# Patient Record
Sex: Female | Born: 1980 | Race: White | Hispanic: No | Marital: Single | Smoking: Current every day smoker
Health system: Southern US, Community
[De-identification: ages and names within clinical notes are randomized; demographics above are authoritative.]

## PROBLEM LIST (undated history)

## (undated) DIAGNOSIS — R519 Headache, unspecified: Secondary | ICD-10-CM

## (undated) DIAGNOSIS — L309 Dermatitis, unspecified: Secondary | ICD-10-CM

## (undated) DIAGNOSIS — G8929 Other chronic pain: Secondary | ICD-10-CM

## (undated) DIAGNOSIS — H5200 Hypermetropia, unspecified eye: Secondary | ICD-10-CM

## (undated) DIAGNOSIS — F419 Anxiety disorder, unspecified: Secondary | ICD-10-CM

## (undated) DIAGNOSIS — M797 Fibromyalgia: Secondary | ICD-10-CM

## (undated) DIAGNOSIS — Z975 Presence of (intrauterine) contraceptive device: Secondary | ICD-10-CM

## (undated) DIAGNOSIS — L509 Urticaria, unspecified: Secondary | ICD-10-CM

## (undated) DIAGNOSIS — R52 Pain, unspecified: Secondary | ICD-10-CM

## (undated) DIAGNOSIS — M542 Cervicalgia: Secondary | ICD-10-CM

## (undated) DIAGNOSIS — M549 Dorsalgia, unspecified: Secondary | ICD-10-CM

## (undated) DIAGNOSIS — R51 Headache: Secondary | ICD-10-CM

## (undated) DIAGNOSIS — T783XXA Angioneurotic edema, initial encounter: Secondary | ICD-10-CM

## (undated) DIAGNOSIS — M25512 Pain in left shoulder: Secondary | ICD-10-CM

## (undated) HISTORY — DX: Anxiety disorder, unspecified: F41.9

## (undated) HISTORY — DX: Presence of (intrauterine) contraceptive device: Z97.5

## (undated) HISTORY — DX: Angioneurotic edema, initial encounter: T78.3XXA

## (undated) HISTORY — DX: Fibromyalgia: M79.7

## (undated) HISTORY — DX: Urticaria, unspecified: L50.9

## (undated) HISTORY — DX: Hypermetropia, unspecified eye: H52.00

## (undated) HISTORY — DX: Dermatitis, unspecified: L30.9

---

## 2003-11-01 ENCOUNTER — Ambulatory Visit (HOSPITAL_COMMUNITY): Admission: EM | Admit: 2003-11-01 | Discharge: 2003-11-01 | Payer: Self-pay | Admitting: Emergency Medicine

## 2004-01-09 ENCOUNTER — Observation Stay (HOSPITAL_COMMUNITY): Admission: AD | Admit: 2004-01-09 | Discharge: 2004-01-10 | Payer: Self-pay | Admitting: Obstetrics and Gynecology

## 2004-01-12 ENCOUNTER — Inpatient Hospital Stay (HOSPITAL_COMMUNITY): Admission: RE | Admit: 2004-01-12 | Discharge: 2004-01-14 | Payer: Self-pay | Admitting: Obstetrics and Gynecology

## 2006-02-16 ENCOUNTER — Inpatient Hospital Stay (HOSPITAL_COMMUNITY): Admission: AD | Admit: 2006-02-16 | Discharge: 2006-02-19 | Payer: Self-pay | Admitting: Obstetrics and Gynecology

## 2009-09-19 ENCOUNTER — Other Ambulatory Visit: Admission: RE | Admit: 2009-09-19 | Discharge: 2009-09-19 | Payer: Self-pay | Admitting: Obstetrics and Gynecology

## 2010-10-30 ENCOUNTER — Other Ambulatory Visit (HOSPITAL_COMMUNITY)
Admission: RE | Admit: 2010-10-30 | Discharge: 2010-10-30 | Disposition: A | Discharge status: Home / Self Care | Payer: Medicaid Other | Source: Ambulatory Visit | Attending: Obstetrics and Gynecology | Admitting: Obstetrics and Gynecology

## 2010-10-30 DIAGNOSIS — Z113 Encounter for screening for infections with a predominantly sexual mode of transmission: Secondary | ICD-10-CM | POA: Insufficient documentation

## 2010-10-30 DIAGNOSIS — Z01419 Encounter for gynecological examination (general) (routine) without abnormal findings: Secondary | ICD-10-CM | POA: Insufficient documentation

## 2011-01-10 NOTE — H&P (Signed)
NAME:  PAZ, FUENTES                         ACCOUNT NO.:  [id number]   MEDICAL RECORD NO.:  [id number]                   PATIENT TYPE:  INP   LOCATION:  A410                                 FACILITY:  [facility]   PHYSICIAN:  Lazaro Arms, M.D.                DATE OF BIRTH:  1980-12-19   DATE OF ADMISSION:  01/12/2004  DATE OF DISCHARGE:  01/14/2004                                HISTORY & PHYSICAL   HISTORY OF PRESENT ILLNESS:  Chelsea Cortez is a 30 year old female, gravida 2,  para 0, abortus 1, with estimated date of delivery of Jan 21, 2004,  currently at [redacted] weeks gestation, who presented to labor and delivery  complaining of __________ uterine contractions.  The patient has had an  otherwise relatively unremarkable antepartum course.  She had a bladder  infection which cleared up and she has been on Lexapro, but otherwise  negative.   PAST MEDICAL HISTORY:  Headaches.   PAST SURGICAL HISTORY:  Negative.   PAST OB HISTORY:  She has had an elective termination in 2002.   MEDICATIONS:  Prenatal vitamins.   REVIEW OF SYSTEMS:  Otherwise negative.   PHYSICAL EXAMINATION:  VITAL SIGNS:  Weight 170 pounds, blood pressure  120/88.  HEENT:  Unremarkable.  NECK:  Thyroid normal.  LUNGS:  Clear.  HEART:  Regular rate and rhythm.  No murmur, rub, or gallop.  BREASTS:  Deferred.  ABDOMEN:  Fundal height of 38 cm.  PELVIC:  Cervix is 4, __________ 5 and minus 2.  EXTREMITIES:  Warm.  No edema.  NEUROLOGIC:  Grossly intact.   LABORATORY DATA:  She is O negative, antibody screen negative.  She has  received RhoGAM.  Her Glucola was normal at 100.  Her group B strep culture  was negative.   IMPRESSION:  Intrauterine pregnancy at term.  Labor.   PLAN:  The patient is admitted for expected management.  She is requesting  an epidural to be placed.    ___________________________________________                                         Lazaro Arms, M.D.   LHE/MEDQ  D:  01/25/2004  T:   01/25/2004  Job:  [id number]
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
## 2011-01-10 NOTE — Op Note (Signed)
NAME:  [name], [name]                         ACCOUNT NO.:  [id number]   MEDICAL RECORD NO.:  [id number]                   PATIENT TYPE:  INP   LOCATION:  A410                                 FACILITY:  [facility]   PHYSICIAN:  Lazaro Arms, M.D.                DATE OF BIRTH:  [birth year]   DATE OF PROCEDURE:  DATE OF DISCHARGE:  01/14/2004                                 OPERATIVE REPORT   EPIDURAL NOTE   Chelsea Cortez is a 30 year old female, gravida 2, para 0, abortus 1 in active  labor requesting epidural be placed for pain management.   DESCRIPTION OF PROCEDURE:  The patient was placed in the sitting position.  Betadine prep was used.  The areas field draped.  Lidocaine 1% is injected  in the L2-L3 interspace.  A 17-gauge Touhy needle was used; the loss of  resistance technique employed, and the epidural is placed without  difficulty.  Ten cc of 0.125% bupivacaine is given as a test dose and the  epidural catheter is threaded into the epidural space without difficulty.  An additional 10 cc is given through the catheter to dose up the epidural  and it is taped down 5 cm into the epidural space.  She is then started on a  continuous pump of 0.125% bupivacaine with 2 mcg/cc of Fentanyl at 12 cc an  hour.  The patient is comfortable.  There are no ill effects.      ___________________________________________                                            Lazaro Arms, M.D.   LHE/MEDQ  D:  01/25/2004  T:  01/25/2004  Job:  [id number]

## 2011-01-10 NOTE — Group Therapy Note (Signed)
NAME:  Chelsea Cortez, Chelsea Cortez               ACCOUNT NO.:  [id number]   MEDICAL RECORD NO.:  [id number]          PATIENT TYPE:  INP   LOCATION:  LDR4                          FACILITY:  [facility]   PHYSICIAN:  Tilda Burrow, M.D. DATE OF BIRTH:  06-Apr-1981   DATE OF PROCEDURE:  DATE OF DISCHARGE:                                   PROGRESS NOTE   DELIVERY NOTE:  We decided to start pushing at 12 p.m. because she had been  complete for about an hour.  However, her epidural was so well set up that  she still did not have an urge to push.  She was fully dilated at +2  station.  Vincenzina pushed very effectively and at 12:30 p.m. she had a  spontaneous vaginal delivery of a viable female infant.  About five minutes  prior to delivery, she had had some bradycardia down in the 60s which would  return to baseline in between contractions.  She was given O2 at 12 liters  via nonrebreather mask and the vertex was elevated slightly in between  contractions.  There was no nuchal cord noted.  The mouth and nose were  suctioned.  After delivery of the head, the baby was rotated  counterclockwise into an oblique position and, after the anterior right arm  was delivered, the patient came without difficulty after that.  Amarissa, in  fact, helped deliver the baby after the shoulders were out by pulling him up  towards her.  Weight is 8 pounds 4 ounces, Apgar's are 9 and 9.  20 units of  pitocin diluted of 1000 cc of lactated ringers was infused rapidly IV.  The  placenta separated spontaneously and delivered via controlled cord traction  at 12:40 p.m.  It was inspected and appears to be intact with a three-vessel  cord.  The fundus was firm and minimal blood loss was noted.  Estimated  blood loss 200 cc.  The epidural catheter was removed and the blue tip  visualized as being intact.  No lacerations were found upon inspection of  the vagina.      Jacklyn Shell, C.N.M.      Tilda Burrow, M.D.  Electronically Signed    FC/MEDQ  D:  02/17/2006  T:  02/17/2006  Job:  [id number]   cc:   [facility]   W. Simone Curia, M.D.  Fax: [phone]

## 2011-01-10 NOTE — H&P (Signed)
NAMESAI, MOURA               ACCOUNT NO.:  [id number]   MEDICAL RECORD NO.:  [id number]          PATIENT TYPE:  INP   LOCATION:  A403                          FACILITY:  [facility]   PHYSICIAN:  Tilda Burrow, M.D. DATE OF BIRTH:  09/21/1980   DATE OF ADMISSION:  02/16/2006  DATE OF DISCHARGE:  LH                                HISTORY & PHYSICAL   CHIEF COMPLAINT:  Induction of labor secondary to maternal request.   HISTORY OF PRESENT ILLNESS:  Chelsea Cortez is a 30 year old gravida 3, para 1, AB  1 with an EDC of June, 27, 2007, placing her at [redacted] weeks gestation.  She  began prenatal care in what she thought was her first trimester, but she was  actually [redacted] weeks pregnant when she started.  She has had regular visits  since then.  Her prenatal course has basically been uneventful.  Total  weight gain has been about 15 pounds with appropriate fundal height growth.  Blood pressures have been 110s to 120s over 60-80s.   PRENATAL LABS:  Blood type O negative.  She did receive RhoGAM on  11/27/2005, rubella immune.  HV, SAG, HIV, HSV, RPR, gonorrhea, chlamydia  are all negative.  She has a history of group B strep with her prior  pregnancy.  One-hour GCT was normal at 96.   PAST MEDICAL HISTORY:  Noncontributory.   SURGICAL HISTORY:  None.   ALLERGIES:  No known drug allergies.   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  She did test positive for  marijuana in her first trimester, with a negative drug screen after that.   FAMILY HISTORY:  Positive for diabetes, hypertension and coronary artery  disease.   OBSTETRIC HISTORY:  Had a spontaneous vaginal delivery, after that a 2-hour  labor at 40 weeks of a 7-pound 1-ounce female in 2005.  She has a TAB.   PHYSICAL EXAMINATION:  HEENT:  Within normal limits.  HEART:  Regular rate and rhythm.  LUNGS:  Clear.  ABDOMEN:  Soft and nontender.  Fundal height is  about 38 centimeters,  estimated fetal weight about 8 pounds.  CERVICAL:  Cervix is 2-3 cm, 50% effaced, -1 station.  LEGS:  Trace edema.   IMPRESSION:  IEP at 40 weeks, selective induction of labor.   PLAN:  Will bring her into labor and delivery on the evening of January 28, 2006  for a Foley pre-induction and AROM and Pitocin in the morning.      Jacklyn Shell, C.N.M.      Tilda Burrow, M.D.  Electronically Signed    FC/MEDQ  D:  02/19/2006  T:  02/19/2006  Job:  [id number]   cc:   Lorin Picket A. Gerda Diss, MD  Fax: [phone]   W. Simone Curia, M.D.  Fax: [phone]

## 2011-01-10 NOTE — Op Note (Signed)
NAME:  Chelsea Cortez, Chelsea Cortez                         ACCOUNT NO.:  [id number]   MEDICAL RECORD NO.:  [id number]                   PATIENT TYPE:  OIB   LOCATION:  A427                                 FACILITY:  [facility]   PHYSICIAN:  Lazaro Arms, M.D.                DATE OF BIRTH:  [birth year]   DATE OF PROCEDURE:  01/12/2004  DATE OF DISCHARGE:                                 OPERATIVE REPORT   DELIVERY NOTE   [name] was noted to be fully dilated at +2 station at approximately 10 a.m.  She pushed effectively and steadily over 2 hours and delivered a viable female  infant, spontaneously, at 1150.  The mouth and nose were suctioned on the  perineum with the DeLee suction.  A compound right hand was delivered next  and the body delivered without difficulty.  Despite lack of stimulation the  baby did make some spontaneous respiratory efforts.  He was transferred to  the radiant warmer where Dr. Vivia Ewing was awaiting in attendance.  You  can see his notes for procedures performed.  The placenta separated  spontaneously and delivered at 1153.  Twenty units of Pitocin diluted in  1000 cc of lactated Ringers was then infused rapidly IV.   The fundus was immediately firm and very little blood loss.  The placenta  was inspected and appears to be intact with a 3-vessel cord.  It will be  sent to pathology for review due to meconium staining.  The vagina was then  inspected and no lacerations were found. EBL 200 cc.  The epidural catheter  was then removed with the blue tip visualized as being intact.     ________________________________________  ___________________________________________  Jacklyn Shell, C.N.M.           Lazaro Arms, M.D.   FC/MEDQ  D:  01/12/2004  T:  01/12/2004  Job:  [id number]   cc:   [facility]

## 2011-01-10 NOTE — Op Note (Signed)
Chelsea Cortez, RUPPEL               ACCOUNT NO.:  [id number]   MEDICAL RECORD NO.:  [id number]          PATIENT TYPE:  INP   LOCATION:  A403                          FACILITY:  [facility]   PHYSICIAN:  Lazaro Arms, M.D.   DATE OF BIRTH:  07/27/1981   DATE OF PROCEDURE:  DATE OF DISCHARGE:  02/19/2006                                 OPERATIVE REPORT   PROCEDURE PERFORMED:  Epidural catheter placement.   Cathlene is a 30 year old white female gravida 2, para 1 at 40 weeks of  gestation who is in active phase of labor requesting epidural to be placed.  She is placed in sitting position.  Betadine prep was used.  1% lidocaine  was injected to the L3, L4 interspace.  The 17 gauge Tuohy needle was used  and loss of resistance technique employed and the epidural space was found  with one pass without difficulty.  10 mL of 0.125% bupivacaine plain was  given and the epidural catheter was fed.  However, when it is fed, it feeds  into a vein and it pulls back blood.  Therefore, I went to the L2-L3  interspace.  I once again used the loss of resistance technique, found the  epidural space in one pass and placed the epidural catheter without  difficulty.  Continuous infusion was then begun at 12 mL an hour.  The  patient tolerated the procedure well and is getting good pain relief.      Lazaro Arms, M.D.  Electronically Signed     LHE/MEDQ  D:  03/19/2006  T:  03/19/2006  Job:  [id number]

## 2011-11-04 ENCOUNTER — Other Ambulatory Visit: Payer: Self-pay | Admitting: Adult Health

## 2011-11-04 ENCOUNTER — Other Ambulatory Visit (HOSPITAL_COMMUNITY)
Admission: RE | Admit: 2011-11-04 | Discharge: 2011-11-04 | Disposition: A | Discharge status: Home / Self Care | Payer: Medicaid Other | Source: Ambulatory Visit | Attending: Obstetrics and Gynecology | Admitting: Obstetrics and Gynecology

## 2011-11-04 DIAGNOSIS — Z01419 Encounter for gynecological examination (general) (routine) without abnormal findings: Secondary | ICD-10-CM | POA: Insufficient documentation

## 2011-11-04 DIAGNOSIS — Z113 Encounter for screening for infections with a predominantly sexual mode of transmission: Secondary | ICD-10-CM | POA: Insufficient documentation

## 2011-11-19 ENCOUNTER — Ambulatory Visit (HOSPITAL_COMMUNITY)
Admission: RE | Admit: 2011-11-19 | Discharge: 2011-11-19 | Disposition: A | Discharge status: Home / Self Care | Payer: Medicaid Other | Source: Ambulatory Visit | Attending: Adult Health | Admitting: Adult Health

## 2011-11-19 ENCOUNTER — Other Ambulatory Visit: Payer: Self-pay | Admitting: Adult Health

## 2011-11-19 DIAGNOSIS — Z09 Encounter for follow-up examination after completed treatment for conditions other than malignant neoplasm: Secondary | ICD-10-CM

## 2011-11-19 DIAGNOSIS — N63 Unspecified lump in unspecified breast: Secondary | ICD-10-CM | POA: Insufficient documentation

## 2012-01-07 ENCOUNTER — Other Ambulatory Visit: Payer: Self-pay | Admitting: Adult Health

## 2012-01-07 DIAGNOSIS — N63 Unspecified lump in unspecified breast: Secondary | ICD-10-CM

## 2012-02-04 ENCOUNTER — Ambulatory Visit (HOSPITAL_COMMUNITY)
Admission: RE | Admit: 2012-02-04 | Discharge: 2012-02-04 | Disposition: A | Discharge status: Home / Self Care | Payer: Medicaid Other | Source: Ambulatory Visit | Attending: Adult Health | Admitting: Adult Health

## 2012-02-04 DIAGNOSIS — N63 Unspecified lump in unspecified breast: Secondary | ICD-10-CM | POA: Insufficient documentation

## 2012-04-28 ENCOUNTER — Ambulatory Visit (HOSPITAL_COMMUNITY): Payer: Medicaid Other

## 2013-04-04 ENCOUNTER — Encounter: Payer: Self-pay | Admitting: Obstetrics & Gynecology

## 2013-04-04 ENCOUNTER — Ambulatory Visit (INDEPENDENT_AMBULATORY_CARE_PROVIDER_SITE_OTHER): Payer: Medicaid Other | Admitting: Obstetrics & Gynecology

## 2013-04-04 ENCOUNTER — Other Ambulatory Visit (HOSPITAL_COMMUNITY)
Admission: RE | Admit: 2013-04-04 | Discharge: 2013-04-04 | Disposition: A | Discharge status: Home / Self Care | Payer: Medicaid Other | Source: Ambulatory Visit | Attending: Obstetrics & Gynecology | Admitting: Obstetrics & Gynecology

## 2013-04-04 VITALS — BP 100/60 | Ht 66.0 in | Wt 159.0 lb

## 2013-04-04 DIAGNOSIS — Z1151 Encounter for screening for human papillomavirus (HPV): Secondary | ICD-10-CM | POA: Insufficient documentation

## 2013-04-04 DIAGNOSIS — Z113 Encounter for screening for infections with a predominantly sexual mode of transmission: Secondary | ICD-10-CM | POA: Insufficient documentation

## 2013-04-04 DIAGNOSIS — Z3049 Encounter for surveillance of other contraceptives: Secondary | ICD-10-CM

## 2013-04-04 DIAGNOSIS — Z01419 Encounter for gynecological examination (general) (routine) without abnormal findings: Secondary | ICD-10-CM | POA: Insufficient documentation

## 2013-04-04 LAB — RPR

## 2013-04-04 LAB — HIV ANTIBODY (ROUTINE TESTING W REFLEX): HIV: NONREACTIVE

## 2013-04-04 LAB — HEPATITIS C ANTIBODY: HCV Ab: NEGATIVE

## 2013-04-04 LAB — HEPATITIS B SURFACE ANTIGEN: Hepatitis B Surface Ag: NEGATIVE

## 2013-04-04 MED ORDER — IBUPROFEN 800 MG PO TABS
800.0000 mg | ORAL_TABLET | Freq: Three times a day (TID) | ORAL | Status: DC | PRN
Start: 2013-04-04 — End: 2013-12-23

## 2013-04-04 NOTE — Addendum Note (Signed)
Addended by: Lazaro Arms on: 04/04/2013 11:49 AM   Modules accepted: Orders

## 2013-04-04 NOTE — Progress Notes (Signed)
Patient ID: Chelsea Cortez, female   DOB: 07-30-1981, 32 y.o.   MRN: [id number] Subjective:     Chelsea Cortez is a 32 y.o. female here for a routine exam.  Patient's last menstrual period was 02/22/2013. G2P2 Current complaints: none.  Personal health questionnaire reviewed: no.   Gynecologic History Patient's last menstrual period was 02/22/2013. Contraception: Nexplanon Last Pap: 2013. Results were: normal Last mammogram: 2013. Results were: normal  Obstetric History OB History   Grav Para Term Preterm Abortions TAB SAB Ect Mult Living   2 2             # Outc Date GA Lbr Len/2nd Wgt Sex Del Anes PTL Lv   1 PAR            2 PAR                The following portions of the patient's history were reviewed and updated as appropriate: allergies, current medications, past family history, past medical history, past social history, past surgical history and problem list.  Review of Systems  Review of Systems  Constitutional: Negative for fever, chills, weight loss, malaise/fatigue and diaphoresis.  HENT: Negative for hearing loss, ear pain, nosebleeds, congestion, sore throat, neck pain, tinnitus and ear discharge.   Eyes: Negative for blurred vision, double vision, photophobia, pain, discharge and redness.  Respiratory: Negative for cough, hemoptysis, sputum production, shortness of breath, wheezing and stridor.   Cardiovascular: Negative for chest pain, palpitations, orthopnea, claudication, leg swelling and PND.  Gastrointestinal: negative for abdominal pain. Negative for heartburn, nausea, vomiting, diarrhea, constipation, blood in stool and melena.  Genitourinary: Negative for dysuria, urgency, frequency, hematuria and flank pain.  Musculoskeletal: Negative for myalgias, back pain, joint pain and falls.  Skin: Negative for itching and rash.  Neurological: Negative for dizziness, tingling, tremors, sensory change, speech change, focal weakness, seizures, loss of consciousness,  weakness and headaches.  Endo/Heme/Allergies: Negative for environmental allergies and polydipsia. Does not bruise/bleed easily.  Psychiatric/Behavioral: Negative for depression, suicidal ideas, hallucinations, memory loss and substance abuse. The patient is not nervous/anxious and does not have insomnia.        Objective:    Physical Exam  Vitals reviewed. Constitutional: She is oriented to person, place, and time. She appears well-developed and well-nourished.  HENT:  Head: Normocephalic and atraumatic.        Right Ear: External ear normal.  Left Ear: External ear normal.  Nose: Nose normal.  Mouth/Throat: Oropharynx is clear and moist.  Eyes: Conjunctivae and EOM are normal. Pupils are equal, round, and reactive to light. Right eye exhibits no discharge. Left eye exhibits no discharge. No scleral icterus.  Neck: Normal range of motion. Neck supple. No tracheal deviation present. No thyromegaly present.  Cardiovascular: Normal rate, regular rhythm, normal heart sounds and intact distal pulses.  Exam reveals no gallop and no friction rub.   No murmur heard. Respiratory: Effort normal and breath sounds normal. No respiratory distress. She has no wheezes. She has no rales. She exhibits no tenderness.  GI: Soft. Bowel sounds are normal. She exhibits no distension and no mass. There is no tenderness. There is no rebound and no guarding.  Genitourinary:       Vulva is normal without lesions Vagina is pink moist without discharge Cervix normal in appearance and pap is done Uterus is normal size shape and contour Adnexa is negative with normal sized ovaries   Musculoskeletal: Normal range of motion. She exhibits no edema  and no tenderness.  Neurological: She is alert and oriented to person, place, and time. She has normal reflexes. She displays normal reflexes. No cranial nerve deficit. She exhibits normal muscle tone. Coordination normal.  Skin: Skin is warm and dry. No rash noted. No  erythema. No pallor.  Psychiatric: She has a normal mood and affect. Her behavior is normal. Judgment and thought content normal.       Assessment:    Healthy female exam.    Plan:    Contraception: Nexplanon. Follow up in: 1 year.

## 2013-04-04 NOTE — Patient Instructions (Signed)

## 2013-04-04 NOTE — Addendum Note (Signed)
Addended by: Colen Darling on: 04/04/2013 12:13 PM   Modules accepted: Orders

## 2013-08-25 DIAGNOSIS — R52 Pain, unspecified: Secondary | ICD-10-CM

## 2013-08-25 HISTORY — DX: Pain, unspecified: R52

## 2013-12-23 ENCOUNTER — Telehealth: Payer: Self-pay | Admitting: *Deleted

## 2013-12-23 ENCOUNTER — Encounter: Payer: Self-pay | Admitting: Obstetrics & Gynecology

## 2013-12-23 ENCOUNTER — Ambulatory Visit (INDEPENDENT_AMBULATORY_CARE_PROVIDER_SITE_OTHER): Payer: BC Managed Care – PPO | Admitting: Obstetrics & Gynecology

## 2013-12-23 VITALS — BP 110/70 | Wt 163.0 lb

## 2013-12-23 DIAGNOSIS — Z113 Encounter for screening for infections with a predominantly sexual mode of transmission: Secondary | ICD-10-CM

## 2013-12-23 DIAGNOSIS — Z7251 High risk heterosexual behavior: Secondary | ICD-10-CM

## 2013-12-23 MED ORDER — IBUPROFEN 800 MG PO TABS: 800.0000 mg | ORAL_TABLET | Freq: Three times a day (TID) | ORAL | Status: DC | PRN

## 2013-12-23 NOTE — Addendum Note (Signed)
Addended by: Lazaro ArmsEURE, Clerance Umland H on: 12/23/2013 01:14 PM   Modules accepted: Orders

## 2013-12-23 NOTE — Progress Notes (Signed)
Patient ID: Chelsea Cortez, female   DOB: 26-Aug-1980, 33 y.o.   MRN: [id number] Pt presents with concerns of herpetic exposure No real concern over currently active lesions Also wants full STD screening  Exam Minimal vertical skin tears on the right of the clitoral hood but no ulcerative lesions are noted No warts or other lesions are present  Check STD labs at pt request No active issues noted today on exam

## 2013-12-23 NOTE — Addendum Note (Signed)
Addended by: Criss AlvinePULLIAM, CHRYSTAL G on: 12/23/2013 01:42 PM   Modules accepted: Orders

## 2013-12-23 NOTE — Telephone Encounter (Signed)
Left message letting pt know she was not tested for herpes. Advised if she is having a concern and feels like she needs to be tested, just call office for appt. JSY

## 2013-12-24 LAB — RPR

## 2013-12-24 LAB — GC/CHLAMYDIA PROBE AMP
CT PROBE, AMP APTIMA: NEGATIVE
GC Probe RNA: NEGATIVE

## 2013-12-24 LAB — HIV ANTIBODY (ROUTINE TESTING W REFLEX): HIV 1&2 Ab, 4th Generation: NONREACTIVE

## 2013-12-24 LAB — HEPATITIS B SURFACE ANTIGEN: HEP B S AG: NEGATIVE

## 2013-12-24 LAB — HEPATITIS C ANTIBODY: HCV AB: NEGATIVE

## 2013-12-26 ENCOUNTER — Telehealth: Payer: Self-pay | Admitting: Obstetrics & Gynecology

## 2013-12-26 NOTE — Telephone Encounter (Signed)
Pt thought that Dr. Despina HiddenEure was going to send the Xanax as well. Can Dr. Despina HiddenEure call in the xanax.

## 2013-12-27 ENCOUNTER — Telehealth: Payer: Self-pay | Admitting: Obstetrics & Gynecology

## 2013-12-27 ENCOUNTER — Other Ambulatory Visit: Payer: Self-pay | Admitting: Obstetrics & Gynecology

## 2013-12-27 LAB — HSV 2 ANTIBODY, IGG

## 2013-12-27 MED ORDER — ALPRAZOLAM 0.5 MG PO TABS: 0.5000 mg | ORAL_TABLET | Freq: Every evening | ORAL | Status: DC | PRN

## 2013-12-27 NOTE — Telephone Encounter (Signed)
Pt aware that [facility], and aware that she will not receive another prescription.

## 2013-12-28 ENCOUNTER — Telehealth: Payer: Self-pay | Admitting: Obstetrics and Gynecology

## 2013-12-28 NOTE — Telephone Encounter (Signed)
Spoke with pt. All labs were normal. JSY

## 2014-01-02 ENCOUNTER — Encounter: Payer: Self-pay | Admitting: Women's Health

## 2014-01-02 ENCOUNTER — Ambulatory Visit (INDEPENDENT_AMBULATORY_CARE_PROVIDER_SITE_OTHER): Payer: BC Managed Care – PPO | Admitting: Women's Health

## 2014-01-02 VITALS — BP 130/76 | Ht 67.0 in | Wt 166.5 lb

## 2014-01-02 DIAGNOSIS — Z30017 Encounter for initial prescription of implantable subdermal contraceptive: Secondary | ICD-10-CM

## 2014-01-02 DIAGNOSIS — Z3046 Encounter for surveillance of implantable subdermal contraceptive: Secondary | ICD-10-CM

## 2014-01-02 DIAGNOSIS — Z3202 Encounter for pregnancy test, result negative: Secondary | ICD-10-CM

## 2014-01-02 LAB — POCT URINE PREGNANCY: Preg Test, Ur: NEGATIVE

## 2014-01-02 MED ORDER — MIRABEGRON ER 25 MG PO TB24: 25.0000 mg | ORAL_TABLET | Freq: Every day | ORAL | Status: DC

## 2014-01-02 NOTE — Progress Notes (Signed)
Patient ID: Chelsea Cortez, female   DOB: 1980/12/24, 33 y.o.   MRN: [id number] Chelsea Cortez is a 33 y.o. year old Caucasian female here for Implanon removal and Nexplanon insertion. She also reports urge incontinence all day, everyday, requests meds to help.  Her Implanon was placed 09/26/09. Patient given informed consent for removal of her Implanon.  Risks/benefits/side effects of Nexplanon have been discussed and her questions have been answered.  Specifically, a failure rate of 08/998 has been reported, with an increased failure rate if pt takes St. John's Wort and/or antiseizure medicaitons.  Chelsea Cortez is aware of the common side effect of irregular bleeding, which the incidence of decreases over time.  BP 130/76  Ht 5\' 7"  (1.702 m)  Wt 166 lb 8 oz (75.524 kg)  BMI 26.07 kg/m2  LMP 12/21/2013  Appropriate time out taken. Implanon site identified.  Area prepped in usual sterile fashon. One cc of 2% lidocaine was used to anesthetize the area at the distal end of the implant. A small stab incision was made right beside the implant on the distal portion.  The Implanon rod was grasped using hemostats and removed without difficulty.  The area was cleansed again with betadine and the Nexplanon was inserted per manufacturer's recommendations without difficulty.  A steri-strip and pressure bandage were applied.There was less than 3 cc blood loss. There were no complications.    Results for orders placed in visit on 01/02/14 (from the past 24 hour(s))  POCT URINE PREGNANCY   Collection Time    01/02/14  9:02 AM      Result Value Ref Range   Preg Test, Ur Negative      Pt was instructed to keep the area clean and dry, remove pressure bandage in 24 hours, and keep insertion site covered with the steri-strip for 3-5 days.  Back up contraception was recommended for 2 weeks.  She was given a card indicating date Nexplanon was inserted and date it needs to be removed. Follow-up PRN problems.   Rx  Myrbetriq 25mg  daily Requests yearly labs, not fasting today, to come back one am this week for CBC, CMP, TSH, Lipid panel  Marge DuncansKimberly Randall Veera Stapleton CNM, [facility]NP-BC 01/02/2014 9:25 AM

## 2014-01-02 NOTE — Patient Instructions (Signed)
Keep the area clean and dry.  You can remove the big bandage in 24 hours, and the small steri-strip bandage in 3-5 days.  A back up method, such as condoms, should be used for two weeks. You may have irregular vaginal bleeding for the first 6 months after the Nexplanon is placed, then the bleeding usually lightens and it is possible that you may not have any periods.  If you have any concerns, please give us a call.   Etonogestrel implant What is this medicine? ETONOGESTREL (et oh noe JES trel) is a contraceptive (birth control) device. It is used to prevent pregnancy. It can be used for up to 3 years. This medicine may be used for other purposes; ask your health care provider or pharmacist if you have questions. COMMON BRAND NAME(S): Implanon, Nexplanon  What should I tell my health care provider before I take this medicine? They need to know if you have any of these conditions: -abnormal vaginal bleeding -blood vessel disease or blood clots -cancer of the breast, cervix, or liver -depression -diabetes -gallbladder disease -headaches -heart disease or recent heart attack -high blood pressure -high cholesterol -kidney disease -liver disease -renal disease -seizures -tobacco smoker -an unusual or allergic reaction to etonogestrel, other hormones, anesthetics or antiseptics, medicines, foods, dyes, or preservatives -pregnant or trying to get pregnant -breast-feeding How should I use this medicine? This device is inserted just under the skin on the inner side of your upper arm by a health care professional. Talk to your pediatrician regarding the use of this medicine in children. Special care may be needed. Overdosage: If you think you've taken too much of this medicine contact a poison control center or emergency room at once. Overdosage: If you think you have taken too much of this medicine contact a poison control center or emergency room at once. NOTE: This medicine is only for you.  Do not share this medicine with others. What if I miss a dose? This does not apply. What may interact with this medicine? Do not take this medicine with any of the following medications: -amprenavir -bosentan -fosamprenavir This medicine may also interact with the following medications: -barbiturate medicines for inducing sleep or treating seizures -certain medicines for fungal infections like ketoconazole and itraconazole -griseofulvin -medicines to treat seizures like carbamazepine, felbamate, oxcarbazepine, phenytoin, topiramate -modafinil -phenylbutazone -rifampin -some medicines to treat HIV infection like atazanavir, indinavir, lopinavir, nelfinavir, tipranavir, ritonavir -St. John's wort This list may not describe all possible interactions. Give your health care provider a list of all the medicines, herbs, non-prescription drugs, or dietary supplements you use. Also tell them if you smoke, drink alcohol, or use illegal drugs. Some items may interact with your medicine. What should I watch for while using this medicine? This product does not protect you against HIV infection (AIDS) or other sexually transmitted diseases. You should be able to feel the implant by pressing your fingertips over the skin where it was inserted. Tell your doctor if you cannot feel the implant. What side effects may I notice from receiving this medicine? Side effects that you should report to your doctor or health care professional as soon as possible: -allergic reactions like skin rash, itching or hives, swelling of the face, lips, or tongue -breast lumps -changes in vision -confusion, trouble speaking or understanding -dark urine -depressed mood -general ill feeling or flu-like symptoms -light-colored stools -loss of appetite, nausea -right upper belly pain -severe headaches -severe pain, swelling, or tenderness in the abdomen -shortness of  breath, chest pain, swelling in a leg -signs of  pregnancy -sudden numbness or weakness of the face, arm or leg -trouble walking, dizziness, loss of balance or coordination -unusual vaginal bleeding, discharge -unusually weak or tired -yellowing of the eyes or skin Side effects that usually do not require medical attention (Report these to your doctor or health care professional if they continue or are bothersome.): -acne -breast pain -changes in weight -cough -fever or chills -headache -irregular menstrual bleeding -itching, burning, and vaginal discharge -pain or difficulty passing urine -sore throat This list may not describe all possible side effects. Call your doctor for medical advice about side effects. You may report side effects to FDA at 1-800-FDA-1088. Where should I keep my medicine? This drug is given in a hospital or clinic and will not be stored at home. NOTE: This sheet is a summary. It may not cover all possible information. If you have questions about this medicine, talk to your doctor, pharmacist, or health care provider.  2014, Elsevier/Gold Standard. (2012-02-16 15:37:45)  Urinary Incontinence Urinary incontinence is the involuntary loss of urine from your bladder. CAUSES  There are many causes of urinary incontinence. They include:  Medicines.  Infections.  Prostatic enlargement, leading to overflow of urine from your bladder.  Surgery.  Neurological diseases.  Emotional factors. SIGNS AND SYMPTOMS Urinary Incontinence can be divided into four types: 1. Urge incontinence. Urge incontinence is the involuntary loss of urine before you have the opportunity to go to the bathroom. There is a sudden urge to void but not enough time to reach a bathroom. 2. Stress incontinence. Stress incontinence is the sudden loss of urine with any activity that forces urine to pass. It is commonly caused by anatomical changes to the pelvis and sphincter areas of your body. 3. Overflow incontinence. Overflow incontinence  is the loss of urine from an obstructed opening to your bladder. This results in a backup of urine and a resultant buildup of pressure within the bladder. When the pressure within the bladder exceeds the closing pressure of the sphincter, the urine overflows, which causes incontinence, similar to water overflowing a dam. 4. Total incontinence. Total incontinence is the loss of urine as a result of the inability to store urine within your bladder. DIAGNOSIS  Evaluating the cause of incontinence may require:  A thorough and complete medical and obstetric history.  A complete physical exam.  Laboratory tests such as a urine culture and sensitivities. When additional tests are indicated, they can include:  An ultrasound exam.  Kidney and bladder X-rays.  Cystoscopy. This is an exam of the bladder using a narrow scope.  Urodynamic testing to test the nerve function to the bladder and sphincter areas. TREATMENT  Treatment for urinary incontinence depends on the cause:  For urge incontinence caused by a bacterial infection, antibiotics will be prescribed. If the urge incontinence is related to medicines you take, your health care provider may have you change the medicine.  For stress incontinence, surgery to re-establish anatomical support to the bladder or sphincter, or both, will often correct the condition.  For overflow incontinence caused by an enlarged prostate, an operation to open the channel through the enlarged prostate will allow the flow of urine out of the bladder. In women with fibroids, a hysterectomy may be recommended.  For total incontinence, surgery on your urinary sphincter may help. An artificial urinary sphincter (an inflatable cuff placed around the urethra) may be required. In women who have developed a hole-like passage  between their bladder and vagina (vesicovaginal fistula), surgery to close the fistula often is required. HOME CARE INSTRUCTIONS  Normal daily hygiene  and the use of pads or adult diapers that are changed regularly will help prevent odors and skin damage.  Avoid caffeine. It can overstimulate your bladder.  Use the bathroom regularly. Try about every 2 3 hours to go to the bathroom, even if you do not feel the need to do so. Take time to empty your bladder completely. After urinating, wait a minute. Then try to urinate again.  For causes involving nerve dysfunction, keep a log of the medicines you take and a journal of the times you go to the bathroom. SEEK MEDICAL CARE IF:  You experience worsening of pain instead of improvement in pain after your procedure.  Your incontinence becomes worse instead of better. SEE IMMEDIATE MEDICAL CARE IF:  You experience fever or shaking chills.  You are unable to pass your urine.  You have redness spreading into your groin or down into your thighs. MAKE SURE YOU:   Understand these instructions.   Will watch your condition.  Will get help right away if you are not doing well or get worse. Document Released: 09/18/2004 Document Revised: 06/01/2013 Document Reviewed: 01/18/2013 [facility] Patient Information 2014 [place].

## 2014-01-02 NOTE — Addendum Note (Signed)
Addended by: Cheral MarkerBOOKER, Jamaar Howes R on: 01/02/2014 09:35 AM   Modules accepted: Level of Service

## 2014-01-03 ENCOUNTER — Other Ambulatory Visit: Payer: BC Managed Care – PPO

## 2014-01-03 DIAGNOSIS — Z13220 Encounter for screening for lipoid disorders: Secondary | ICD-10-CM

## 2014-01-03 DIAGNOSIS — Z1329 Encounter for screening for other suspected endocrine disorder: Secondary | ICD-10-CM

## 2014-01-03 DIAGNOSIS — Z0000 Encounter for general adult medical examination without abnormal findings: Secondary | ICD-10-CM

## 2014-01-03 LAB — COMPREHENSIVE METABOLIC PANEL WITH GFR
ALT: 12 U/L (ref 0–35)
AST: 16 U/L (ref 0–37)
Albumin: 4.1 g/dL (ref 3.5–5.2)
Alkaline Phosphatase: 51 U/L (ref 39–117)
BUN: 11 mg/dL (ref 6–23)
CO2: 24 meq/L (ref 19–32)
Calcium: 9 mg/dL (ref 8.4–10.5)
Chloride: 108 meq/L (ref 96–112)
Creat: 0.77 mg/dL (ref 0.50–1.10)
Glucose, Bld: 83 mg/dL (ref 70–99)
Potassium: 4.2 meq/L (ref 3.5–5.3)
Sodium: 140 meq/L (ref 135–145)
Total Bilirubin: 0.5 mg/dL (ref 0.2–1.2)
Total Protein: 6.2 g/dL (ref 6.0–8.3)

## 2014-01-03 LAB — LIPID PANEL
Cholesterol: 124 mg/dL (ref 0–200)
HDL: 38 mg/dL — ABNORMAL LOW
LDL Cholesterol: 76 mg/dL (ref 0–99)
Total CHOL/HDL Ratio: 3.3 ratio
Triglycerides: 52 mg/dL
VLDL: 10 mg/dL (ref 0–40)

## 2014-01-03 LAB — CBC
HCT: 43.9 % (ref 36.0–46.0)
Hemoglobin: 15 g/dL (ref 12.0–15.0)
MCH: 31.8 pg (ref 26.0–34.0)
MCHC: 34.2 g/dL (ref 30.0–36.0)
MCV: 93.2 fL (ref 78.0–100.0)
Platelets: 195 K/uL (ref 150–400)
RBC: 4.71 MIL/uL (ref 3.87–5.11)
RDW: 12.7 % (ref 11.5–15.5)
WBC: 5.6 K/uL (ref 4.0–10.5)

## 2014-01-04 LAB — TSH: TSH: 1.69 u[IU]/mL (ref 0.350–4.500)

## 2014-01-10 ENCOUNTER — Emergency Department (HOSPITAL_COMMUNITY)
Admission: EM | Admit: 2014-01-10 | Discharge: 2014-01-10 | Disposition: A | Discharge status: Home / Self Care | Payer: BC Managed Care – PPO | Attending: Emergency Medicine | Admitting: Emergency Medicine

## 2014-01-10 ENCOUNTER — Encounter (HOSPITAL_COMMUNITY): Payer: Self-pay | Admitting: Emergency Medicine

## 2014-01-10 DIAGNOSIS — Y929 Unspecified place or not applicable: Secondary | ICD-10-CM | POA: Insufficient documentation

## 2014-01-10 DIAGNOSIS — Z8669 Personal history of other diseases of the nervous system and sense organs: Secondary | ICD-10-CM | POA: Insufficient documentation

## 2014-01-10 DIAGNOSIS — Y939 Activity, unspecified: Secondary | ICD-10-CM | POA: Insufficient documentation

## 2014-01-10 DIAGNOSIS — Z79899 Other long term (current) drug therapy: Secondary | ICD-10-CM | POA: Insufficient documentation

## 2014-01-10 DIAGNOSIS — F17200 Nicotine dependence, unspecified, uncomplicated: Secondary | ICD-10-CM | POA: Insufficient documentation

## 2014-01-10 DIAGNOSIS — IMO0002 Reserved for concepts with insufficient information to code with codable children: Secondary | ICD-10-CM | POA: Insufficient documentation

## 2014-01-10 DIAGNOSIS — S30860A Insect bite (nonvenomous) of lower back and pelvis, initial encounter: Secondary | ICD-10-CM | POA: Insufficient documentation

## 2014-01-10 DIAGNOSIS — Z792 Long term (current) use of antibiotics: Secondary | ICD-10-CM | POA: Insufficient documentation

## 2014-01-10 DIAGNOSIS — Z9109 Other allergy status, other than to drugs and biological substances: Secondary | ICD-10-CM

## 2014-01-10 DIAGNOSIS — W57XXXA Bitten or stung by nonvenomous insect and other nonvenomous arthropods, initial encounter: Secondary | ICD-10-CM | POA: Insufficient documentation

## 2014-01-10 DIAGNOSIS — J309 Allergic rhinitis, unspecified: Secondary | ICD-10-CM | POA: Insufficient documentation

## 2014-01-10 DIAGNOSIS — F411 Generalized anxiety disorder: Secondary | ICD-10-CM | POA: Insufficient documentation

## 2014-01-10 DIAGNOSIS — Z8739 Personal history of other diseases of the musculoskeletal system and connective tissue: Secondary | ICD-10-CM | POA: Insufficient documentation

## 2014-01-10 MED ORDER — DEXAMETHASONE 4 MG PO TABS: ORAL_TABLET | ORAL | Status: DC

## 2014-01-10 MED ORDER — METHYLPREDNISOLONE SODIUM SUCC 40 MG IJ SOLR
80.0000 mg | Freq: Once | INTRAMUSCULAR | Status: AC
  Administered 2014-01-10: 80 mg via INTRAMUSCULAR
  Filled 2014-01-10: qty 2

## 2014-01-10 MED ORDER — DIPHENHYDRAMINE HCL 50 MG/ML IJ SOLN
50.0000 mg | Freq: Once | INTRAMUSCULAR | Status: AC
  Administered 2014-01-10: 50 mg via INTRAMUSCULAR
  Filled 2014-01-10: qty 1

## 2014-01-10 NOTE — ED Notes (Signed)
Pt refused to wait for discharge instructions. Pt signed discharge. Instructions given to friend while she waits in the car. Unable to obtain vital signs or visual acuity.

## 2014-01-10 NOTE — Discharge Instructions (Signed)
Please continue your current medications including the medications of your eye specialist. Please add Decadron 2 times daily with food. Please see your eye specialist as scheduled. Please return to the emergency department if any shortness of breath, difficulty swallowing or other changes or concerns.

## 2014-01-10 NOTE — ED Notes (Signed)
Pt from home. Pt states she was bitten by a tick on right buttox and removed tick. Pt c/o facial swelling starting on Sunday and still present. Pt c/o generalized pain.

## 2014-01-10 NOTE — ED Notes (Signed)
Hobson PA at bedside,  

## 2014-01-10 NOTE — ED Provider Notes (Signed)
CSN: [id number]     Arrival date & time 01/10/14  [id number] History   First MD Initiated Contact with Patient 01/10/14 [phone]     Chief Complaint  Patient presents with  . Facial Swelling     (Consider location/radiation/quality/duration/timing/severity/associated sxs/prior Treatment) HPI Comments: Patient is a 33 year old female who presents to the emergency department with a complaint of facial swelling. The patient also states that she had a tick bite to the right buttocks. The patient states that she has been having problems with allergies from time to time, however on Sunday, May 17 she began to have some facial swelling. She tried Benadryl, cool compresses, and eyedrops but this did not help. The patient was seen by an eye specialist on yesterday. The patient was started on steroid eyedrops and other I. antibiotics. The patient had already taken Zithromax for possibility of infection. The patient states that she was concerned that about the same time she pulled a tick off that her eyes began to swell. She's not had any high fever. There's been no unusual rash. The patient has had some sneezing and runny nose and watery eyes. The patient states that she is on allergy medicines and she does not understand why she is having exacerbation of symptoms and she is still on medication. She presents now for additional evaluation and management of this problem.  The history is provided by the patient.    Past Medical History  Diagnosis Date  . Anxiety   . Farsightedness   . Fibromyalgia    History reviewed. No pertinent past surgical history. Family History  Problem Relation Age of Onset  . Diabetes Mother   . Diabetes Maternal Grandmother   . Heart murmur Maternal Grandmother    History  Substance Use Topics  . Smoking status: Current Every Day Smoker    Types: Cigarettes  . Smokeless tobacco: Never Used  . Alcohol Use: Yes   OB History   Grav Para Term Preterm Abortions TAB SAB Ect Mult  Living   2 2             Review of Systems  Constitutional: Negative for activity change.       All ROS Neg except as noted in HPI  HENT: Positive for congestion, facial swelling, postnasal drip, sinus pressure, sneezing and sore throat.   Eyes: Negative for photophobia and discharge.  Respiratory: Positive for cough. Negative for shortness of breath and wheezing.   Cardiovascular: Negative for chest pain and palpitations.  Gastrointestinal: Negative for abdominal pain and blood in stool.  Genitourinary: Negative for dysuria, frequency and hematuria.  Musculoskeletal: Negative for arthralgias, back pain and neck pain.  Skin: Negative.   Neurological: Negative for dizziness, seizures and speech difficulty.  Psychiatric/Behavioral: Negative for hallucinations and confusion.      Allergies  Review of patient's allergies indicates no known allergies.  Home Medications   Prior to Admission medications   Medication Sig Start Date End Date Taking? Authorizing Provider  Alcaftadine (LASTACAFT) 0.25 % SOLN Apply to eye daily.   Yes Historical Provider, MD  ALPRAZolam Prudy Feeler(XANAX) 0.5 MG tablet Take 1 tablet (0.5 mg total) by mouth at bedtime as needed for sleep. 12/27/13  Yes Lazaro ArmsLuther H Eure, MD  diphenhydrAMINE (BENADRYL) 25 MG tablet Take 25 mg by mouth every 6 (six) hours as needed for itching or allergies.   Yes Historical Provider, MD  Etonogestrel (NEXPLANON Girard) Inject 1 Device into the skin.   Yes Historical Provider, MD  fluticasone Aleda Grana(FLONASE)  50 MCG/ACT nasal spray Place 2 sprays into both nostrils daily.    Yes Historical Provider, MD  gabapentin (NEURONTIN) 400 MG capsule Take 400 mg by mouth 4 (four) times daily.   Yes Historical Provider, MD  HYDROMET 5-1.5 MG/5ML syrup Take 5 mLs by mouth daily as needed for cough.  12/28/13  Yes Historical Provider, MD  ibuprofen (ADVIL,MOTRIN) 800 MG tablet Take 800 mg by mouth every 8 (eight) hours as needed for moderate pain.   Yes Historical  Provider, MD  Polyvinyl Alcohol-Povidone (REFRESH OP) Apply to eye 4 (four) times daily.   Yes Historical Provider, MD  prednisoLONE acetate (PRED FORTE) 1 % ophthalmic suspension Place 1 drop into both eyes 4 (four) times daily.   Yes Historical Provider, MD  tobramycin (TOBREX) 0.3 % ophthalmic solution Place 1 drop into both eyes 4 (four) times daily.  01/09/14  Yes Historical Provider, MD  mirabegron ER (MYRBETRIQ) 25 MG TB24 tablet Take 1 tablet (25 mg total) by mouth daily. 01/02/14   Marge Duncans, CNM   BP 127/81  Pulse 80  Temp(Src) 98.3 F (36.8 C) (Oral)  Resp 16  Ht 5\' 8"  (1.727 m)  Wt 163 lb (73.936 kg)  BMI 24.79 kg/m2  SpO2 100%  LMP 12/21/2013 Physical Exam  Nursing note and vitals reviewed. Constitutional: She is oriented to person, place, and time. She appears well-developed and well-nourished.  Non-toxic appearance.  HENT:  Head: Normocephalic.  Right Ear: Tympanic membrane and external ear normal.  Left Ear: Tympanic membrane and external ear normal.  Nasal congestion present.  There is mild to moderate increased redness of the posterior pharynx. The uvula is in the midline. The speech is clear and understandable.  Eyes: EOM and lids are normal. Pupils are equal, round, and reactive to light.  There is moderate swelling of the eyes, both upper and lower lids. There is mild swelling under the eye. This has improved compared to a picture that the patient took on yesterday and this early a.m.  The extraocular movements are intact. The anterior chambers are clear. The globes are firm on the right and on the left. There is no increased redness or increased heat related to the orbits.    Neck: Normal range of motion. Neck supple. Carotid bruit is not present.  Cardiovascular: Normal rate, regular rhythm, normal heart sounds, intact distal pulses and normal pulses.   Pulmonary/Chest: Breath sounds normal. No respiratory distress.  Abdominal: Soft. Bowel sounds  are normal. There is no tenderness. There is no guarding.  Genitourinary:  There is a raised red area that is mildly tender with a scab in the center of the right buttocks. There is no red streaking appreciated. There is no drainage appreciated from the area. There is no evidence of any portion of the tick left in the site.  Musculoskeletal: Normal range of motion.  Lymphadenopathy:       Head (right side): No submandibular adenopathy present.       Head (left side): No submandibular adenopathy present.    She has no cervical adenopathy.  Neurological: She is alert and oriented to person, place, and time. She has normal strength. No cranial nerve deficit or sensory deficit.  Skin: Skin is warm and dry. No rash noted.  Psychiatric: She has a normal mood and affect. Her speech is normal.    ED Course  Procedures (including critical care time) Labs Review Labs Reviewed - No data to display  Imaging Review No results found.  EKG Interpretation None      MDM Patient has a history of allergies. She has been seen by ophthalmology specialist and placed on steroid drops as well as antibiotic drops. I have explained to the patient that she may not have given the medications enough time. The patient is quite insistent that she receive a injection of Benadryl and anything else to help with the swelling going on in her face. The patient is treated with intramuscular Benadryl and Solu-Medrol.  The patient has a slightly raised red area with a scab in the middle at the site of her previous tick bite. There is no red streaks appreciated. The area is not hot. There is no drainage. There is no rash appreciated and no high fever. I have attempted to educate the patient on tick bites and in particular [facility] fever and possible infections.  The patient will followup with her eye specialist in 2 days. The patient is invited to return to the emergency department if any changes, problems, or  concerns.    Final diagnoses:  None    **I have reviewed nursing notes, vital signs, and all appropriate lab and imaging results for this patient.Kathie Dike*    Berlie Persky M Breeanna Galgano, PA-C 01/10/14 1102

## 2014-01-10 NOTE — ED Notes (Signed)
Pt c/o bilateral eye swelling, clear drainage at times, redness, itching that started a few days ago, also complains of nasal drainage, was seen by eye doctor yesterday, given steroid, antibiotic eyes drops and advised to take otc benadryl, pt states that she is not any better this am, pt also concerned because she removed a tick from right buttock area over the weekend and the area around the site is red and feels "hard", painful to touch, pt has quarter size redness noted around tick bite area,

## 2014-01-11 ENCOUNTER — Other Ambulatory Visit: Payer: Self-pay | Admitting: Orthopedic Surgery

## 2014-01-11 ENCOUNTER — Telehealth: Payer: Self-pay

## 2014-01-11 ENCOUNTER — Ambulatory Visit (HOSPITAL_COMMUNITY)
Admission: RE | Admit: 2014-01-11 | Discharge: 2014-01-11 | Disposition: A | Discharge status: Home / Self Care | Payer: BC Managed Care – PPO | Source: Ambulatory Visit | Attending: Orthopedic Surgery | Admitting: Orthopedic Surgery

## 2014-01-11 DIAGNOSIS — M542 Cervicalgia: Secondary | ICD-10-CM | POA: Insufficient documentation

## 2014-01-11 NOTE — Telephone Encounter (Signed)
Pt informed of WNL lab results from 01/03/2014.

## 2014-01-11 NOTE — ED Provider Notes (Signed)
Medical screening examination/treatment/procedure(s) were performed by non-physician practitioner and as supervising physician I was immediately available for consultation/collaboration.   EKG Interpretation None        Adrin Julian L Cleston Lautner, MD 01/11/14 1309 

## 2014-01-12 ENCOUNTER — Ambulatory Visit (INDEPENDENT_AMBULATORY_CARE_PROVIDER_SITE_OTHER): Payer: BC Managed Care – PPO | Admitting: Orthopedic Surgery

## 2014-01-12 ENCOUNTER — Encounter: Payer: Self-pay | Admitting: Orthopedic Surgery

## 2014-01-12 VITALS — BP 122/60 | Ht 68.0 in | Wt 165.0 lb

## 2014-01-12 DIAGNOSIS — IMO0001 Reserved for inherently not codable concepts without codable children: Secondary | ICD-10-CM

## 2014-01-12 DIAGNOSIS — IMO0002 Reserved for concepts with insufficient information to code with codable children: Secondary | ICD-10-CM

## 2014-01-12 DIAGNOSIS — M542 Cervicalgia: Secondary | ICD-10-CM

## 2014-01-12 DIAGNOSIS — M791 Myalgia, unspecified site: Secondary | ICD-10-CM

## 2014-01-12 DIAGNOSIS — M797 Fibromyalgia: Secondary | ICD-10-CM

## 2014-01-12 DIAGNOSIS — G894 Chronic pain syndrome: Secondary | ICD-10-CM

## 2014-01-12 DIAGNOSIS — M545 Low back pain, unspecified: Secondary | ICD-10-CM

## 2014-01-12 NOTE — Progress Notes (Signed)
Patient ID: Randolm Idolrystal G Mejorado, female   DOB: July 30, 1981, 33 y.o.   MRN: [id number]  Chief complaint multiple complaints. Patient scheduled for evaluation of neck pain with x-ray done prior to her visit  But really is complaining of left shoulder pain posterior scapular pain lower back pain for 4 years. Previous treatment with a chiropractor and is on ibuprofen 800 mg  She has a history of fibromyalgia currently on 10 mg of Neurontin per day. X-rays of her neck were normal but she complains of 9/10 constant pain which runs down the back starting in the cervical spine with pain also running into the left shoulder. She describes a burning stabbing throbbing sensation.  She denies any serious trauma. Current medications are Xanax and Zyrtec previous surgery includes childbirth in 2005 2007. She has no knee many many multiple medical problems no allergies except to codeine  Has a family history of diabetes lung disease tuberculosis asthma ulcers gastrointestinal disease heart disease hypertension stroke heart attack DVT coronary artery disease peripheral vascular disease mental illness thyroid kidney disease cancer and arthritis review of systems positive findings include difficulty sleeping hearing problems bleeding disorders swelling of the lower chest remedy is shortness of breath cough breathing difficulties which I believe is due to allergy heartburn or she wears glasses has some visual problems headaches dizziness memory problems tingling numbness bladder infections anxiety  There is also possible history of hepatitis  BP 122/60  Ht 5\' 8"  (1.727 m)  Wt 165 lb (74.844 kg)  BMI 25.09 kg/m2  LMP 12/21/2013 General appearance is normal, the patient is alert and oriented x3 with normal mood and affect.  Upper extremity exam  The right and left upper extremity:   Inspection and palpation tenderness in the cervical spine left superior border of the scapula   Range of motion is full without  contracture.  Motor exam is normal with grade 5 strength.  The joints are fully reduced without subluxation.  There is no atrophy or tremor and muscle tone is normal.  All joints are stable.  Lower extremity exam  Ambulation is normal.  The right and left lower extremity:  Inspection and palpation revealed no tenderness or abnormality in alignment in the lower extremities. Range of motion is full.  Strength is grade 5.  All joints are stable.  Straight leg raises and neurovascular exam were normal with normal reflexes  Encounter Diagnoses  Name Primary?  . Neck pain Yes  . Trigger point   . Chronic pain syndrome   . Fibromyalgia   . Lumbar pain     Orders Placed This Encounter  Procedures  . Ambulatory referral to Physical Therapy    Referral Priority:  Routine    Referral Type:  Physical Medicine    Referral Reason:  Specialty Services Required    Requested Specialty:  Physical Therapy    Number of Visits Requested:  1   NCS, call results to her   No follow ups needed

## 2014-01-12 NOTE — Patient Instructions (Addendum)
Referral to Dr Weldon Inchestoole for chronic pain  PT referral Call to arrange NCS (Dr Romeo AppleHarrison will call results to you)

## 2014-01-18 ENCOUNTER — Telehealth: Payer: Self-pay | Admitting: *Deleted

## 2014-01-18 ENCOUNTER — Other Ambulatory Visit: Payer: Self-pay | Admitting: *Deleted

## 2014-01-18 DIAGNOSIS — M542 Cervicalgia: Secondary | ICD-10-CM

## 2014-01-18 DIAGNOSIS — G894 Chronic pain syndrome: Secondary | ICD-10-CM

## 2014-01-18 NOTE — Telephone Encounter (Signed)
Office notes and referrals were faxed to Dr. Gerilyn Pilgrim for NCS, and Dr. Weldon Inches for pain management. Awaiting appointments.

## 2014-01-20 ENCOUNTER — Encounter: Payer: Self-pay | Admitting: Family Medicine

## 2014-01-20 ENCOUNTER — Ambulatory Visit (INDEPENDENT_AMBULATORY_CARE_PROVIDER_SITE_OTHER): Payer: BC Managed Care – PPO | Admitting: Family Medicine

## 2014-01-20 VITALS — BP 112/80 | Temp 98.7°F | Ht 68.0 in | Wt 171.2 lb

## 2014-01-20 DIAGNOSIS — IMO0001 Reserved for inherently not codable concepts without codable children: Secondary | ICD-10-CM

## 2014-01-20 DIAGNOSIS — G894 Chronic pain syndrome: Secondary | ICD-10-CM

## 2014-01-20 DIAGNOSIS — H109 Unspecified conjunctivitis: Secondary | ICD-10-CM

## 2014-01-20 DIAGNOSIS — M797 Fibromyalgia: Secondary | ICD-10-CM

## 2014-01-20 MED ORDER — CLARITHROMYCIN 500 MG PO TABS: 500.0000 mg | ORAL_TABLET | Freq: Two times a day (BID) | ORAL | Status: DC

## 2014-01-20 MED ORDER — GENTAMICIN SULFATE 0.3 % OP SOLN: 2.0000 [drp] | Freq: Four times a day (QID) | OPHTHALMIC | Status: DC

## 2014-01-20 MED ORDER — CETIRIZINE HCL 10 MG PO TABS
10.0000 mg | ORAL_TABLET | Freq: Every day | ORAL | Status: DC
Start: 2014-01-20 — End: 2015-01-02

## 2014-01-20 NOTE — Telephone Encounter (Signed)
Patient has an appointment with Dr. Gerilyn Pilgrim 01/25/14 at 2:00 pm.

## 2014-01-20 NOTE — Progress Notes (Signed)
   Subjective:    Patient ID: Chelsea Cortez, female    DOB: 08-29-1980, 33 y.o.   MRN: [id number]  HPI Patient is here today because she is having problems with her allergies. Patient's eyes are swollen, red, watery and draining. Sore throat and runny nose noted. This has been present for about 1 month now. Patient has been treated at the Urgent Care and eye doctor without any relief.   Went to the er with swollen and painful and itchy eyes  Generally no hx of spring allergies, got shot of benadryl and steroids  Throat is more painful in morn and night  Went to eye dr Mora Appl on tobramycin qid and daily lastacaft daily and pred fort  Then stared on zantac and singulair by eye doc on subsequent visit  Initial eye crustiness and cong for son. His illness lasted for a week and then faded away. He had significant conjunctivitis early ulcer.  Does not wear glasses  Patient states she has no other concerns at this time.   Minimal history of allergies  Review of Systems No chest pain no weight loss no abdominal pain no change in bowel habits no blood in stool no rash ROS otherwise negative    Objective:   Physical Exam Alert mild malaise. Eyelids mildly swollen. Eyes injected crusty. No iritis ocular exam otherwise normal. Preauricular nodes present slightly tender pharynx normal neck supple. Lungs clear. Heart regular in rhythm.       Assessment & Plan:  Impression complicated history of recent profound bilateral conjunctivitis. Associated with congestion stuffiness. Some had similar illness. Now on a shotgun approach with Singulair Zantac tobramycin prednisolone drops. Has seen an optometrist multiple times. Long discussion held. Doubt nature allergy component. Plan stop Zantac. Stop Singulair. Stop tobramycin. Switch to Garamycin. Biaxin twice a day 10 days. Visine or similar over-the-counter. Maintain once per day antihistamine. If persists into next week call and we will set up  with ophthalmologist. Rationale discussed. 25 minutes spent with this new patient. WSL

## 2014-01-24 ENCOUNTER — Telehealth: Payer: Self-pay | Admitting: Family Medicine

## 2014-01-24 ENCOUNTER — Ambulatory Visit: Payer: BC Managed Care – PPO | Admitting: Family Medicine

## 2014-01-24 DIAGNOSIS — H109 Unspecified conjunctivitis: Secondary | ICD-10-CM

## 2014-01-24 NOTE — Telephone Encounter (Signed)
Sorry had "appt conflict". Ophthalm referral. cst

## 2014-01-24 NOTE — Telephone Encounter (Signed)
Seen on 01/20/14. Prescribed garamycin, zyrtec, and biaxin.

## 2014-01-24 NOTE — Telephone Encounter (Signed)
Patient had an appointment today to recheck the conjunctivitis, but had to cancel due to appointment conflict. She would like to know if we can see her later this afternoon or go ahead and send her to medical eye doctor. She said it has gotten really bad. She said they will get extremely gunky and her eyes will cross and its hard for her to drive.

## 2014-01-24 NOTE — Telephone Encounter (Signed)
Patient notified cst and referral initiated in the system.

## 2014-01-26 ENCOUNTER — Ambulatory Visit (HOSPITAL_COMMUNITY): Payer: BC Managed Care – PPO | Admitting: Physical Therapy

## 2014-02-06 ENCOUNTER — Ambulatory Visit (HOSPITAL_COMMUNITY): Payer: BC Managed Care – PPO | Attending: Orthopedic Surgery | Admitting: Physical Therapy

## 2014-06-26 ENCOUNTER — Encounter: Payer: Self-pay | Admitting: Family Medicine

## 2015-01-02 ENCOUNTER — Ambulatory Visit (INDEPENDENT_AMBULATORY_CARE_PROVIDER_SITE_OTHER): Payer: Medicaid Other | Admitting: Family Medicine

## 2015-01-02 ENCOUNTER — Encounter: Payer: Self-pay | Admitting: Family Medicine

## 2015-01-02 VITALS — BP 122/78 | Temp 98.1°F | Ht 68.0 in | Wt 173.5 lb

## 2015-01-02 DIAGNOSIS — J329 Chronic sinusitis, unspecified: Secondary | ICD-10-CM

## 2015-01-02 MED ORDER — AMOXICILLIN-POT CLAVULANATE 875-125 MG PO TABS: 1.0000 | ORAL_TABLET | Freq: Two times a day (BID) | ORAL | Status: AC

## 2015-01-02 NOTE — Progress Notes (Signed)
   Subjective:    Patient ID: Chelsea Cortez, female    DOB: 03-Feb-1981, 34 y.o.   MRN: [id number]  Sinusitis This is a new problem. The current episode started 1 to 4 weeks ago. The problem has been gradually worsening since onset. There has been no fever. Her pain is at a severity of 8/10. The pain is moderate. Associated symptoms include congestion, coughing, ear pain, headaches, a hoarse voice, sinus pressure, sneezing, a sore throat and swollen glands. Past treatments include nothing. The treatment provided no relief.   Patient has no other concerns this visit. Cong dranafge and cong  Both ears painful  enrgy   Low abd disc with occas cramping  Some mod reflux sym uses ocas tums  Review of Systems  HENT: Positive for congestion, ear pain, hoarse voice, sinus pressure, sneezing and sore throat.   Respiratory: Positive for cough.   Neurological: Positive for headaches.       Objective:   Physical Exam  Alert moderate malaise vital stable frontal maxillary tenderness pharynx erythematous neck supple lungs clear. Heart regular in rhythm.      Assessment & Plan:  Impression acute rhinosinusitis plan antibiotics prescribed. Symptom care discussed. Warning signs discussed seen after-hours rather than sent to emergency room WSL

## 2015-01-15 ENCOUNTER — Ambulatory Visit (INDEPENDENT_AMBULATORY_CARE_PROVIDER_SITE_OTHER): Payer: Medicaid Other | Admitting: Adult Health

## 2015-01-15 ENCOUNTER — Encounter: Payer: Self-pay | Admitting: Adult Health

## 2015-01-15 VITALS — BP 110/80 | HR 96 | Ht 67.25 in | Wt 170.0 lb

## 2015-01-15 DIAGNOSIS — Z30017 Encounter for initial prescription of implantable subdermal contraceptive: Secondary | ICD-10-CM | POA: Insufficient documentation

## 2015-01-15 DIAGNOSIS — Z113 Encounter for screening for infections with a predominantly sexual mode of transmission: Secondary | ICD-10-CM

## 2015-01-15 DIAGNOSIS — Z0000 Encounter for general adult medical examination without abnormal findings: Secondary | ICD-10-CM | POA: Diagnosis not present

## 2015-01-15 DIAGNOSIS — Z01419 Encounter for gynecological examination (general) (routine) without abnormal findings: Secondary | ICD-10-CM | POA: Diagnosis not present

## 2015-01-15 DIAGNOSIS — Z975 Presence of (intrauterine) contraceptive device: Secondary | ICD-10-CM

## 2015-01-15 HISTORY — DX: Presence of (intrauterine) contraceptive device: Z97.5

## 2015-01-15 NOTE — Patient Instructions (Signed)
No advil for 2 weeks, can try tylenol if needed Use warm cloth to clitoris area before sex and use good lubricate Pap and physical in 1 year Analgesic Rebound Headaches An analgesic rebound headache is a headache that returns after pain medicine (analgesic) that was taken to treat the initial headache wears off. People who suffer from tension, migraine, or cluster headaches are at risk for developing rebound headaches. Any type of primary headache can return as a rebound headache if you regularly take analgesics more than three times a week. If the cycle of rebound headaches continues, they become chronic daily headaches.  CAUSES Analgesics frequently associated with this problem include common over-the-counter medicines like aspirin, ibuprofen, acetaminophen, sinus relief medicines, and other medicines that contain caffeine. Narcotic pain medicines are also a common cause of rebound headaches.  SIGNS AND SYMPTOMS The symptoms of rebound headaches are the same as the symptoms of your initial headache. Symptoms of specific types of headaches include: Tension headache  Pressure around the head.  Dull, aching head pain.  Pain felt over the front and sides of the head.  Tenderness in the muscles of the head, neck and shoulders. Migraine Headache  Pulsing or throbbing pain on one or both sides of the head.  Severe pain that interferes with daily activities.  Pain that is worsened by physical activity.  Nausea, vomiting, or both.  Pain with exposure to bright light, loud noises, or strong smells.  General sensitivity to bright light, loud noises, or strong smells.  Visual changes.  Numbness of one or both arms. Cluster Headaches  Severe pain that begins in or around one eye or temple.  Redness in the eye on the same side as the pain.  Droopy or swollen eyelid.  One-sided head pain.  Nausea.  Runny nose.  Sweaty, pale facial skin.  Restlessness. DIAGNOSIS  Analgesic  rebound headaches are diagnosed by reviewing your medical history. This includes the nature of your initial headaches, as well as the type of pain medicines you have been using to treat your headaches and how often you take them. TREATMENT Discontinuing frequent use of the analgesic medicine will typically reduce the frequency of the rebound episodes. This may initially worsen your headaches but eventually the pain should become more manageable, less frequent, and less severe.  Seeing a headache specialists may helpful. He or she may be able to help you manage your headaches and to make sure there is not another cause of the headaches. Alternative methods of stress relief such as acupuncture, counseling, biofeedback, and massage may also be helpful. Talk with your health care provider about which alternative treatments might be good for you. HOME CARE INSTRUCTIONS Stopping the regular use of pain medicine can be difficult. Follow your health care provider's instructions carefully. Keep all of your appointments. Avoid triggers that are known to cause your primary headaches. SEEK MEDICAL CARE IF: You continue to experience headaches after following your health care provider's recommended treatments. SEEK IMMEDIATE MEDICAL CARE IF:  You develop new headache pain.  You develop headache pain that is different than what you have experienced in the past.  You develop numbness or tingling in your arms or legs.  You develop changes in your speech or vision. MAKE SURE YOU:  Understand these instructions.  Will watch your child's condition.  Will get help right away if your child is not doing well or gets worse. Document Released: 11/01/2003 Document Revised: 12/26/2013 Document Reviewed: 02/24/2013 [facility] Patient Information 2015 [place], [place].  This information is not intended to replace advice given to you by your health care provider. Make sure you discuss any questions you have with your health  care provider. will talk when labs back

## 2015-01-15 NOTE — Progress Notes (Signed)
Patient ID: Chelsea Cortez, female   DOB: Sep 29, 1980, 34 y.o.   MRN: [id number] History of Present Illness:  Chelsea Cortez is a 34 year old white female in for well woman gyn exam.She had a normal pap with negative HPV 04/04/13.  Current Medications, Allergies, Past Medical History, Past Surgical History, Family History and Social History were reviewed in [facility] electronic medical record.     Review of Systems: Patient denies any hearing loss, fatigue, blurred vision, shortness of breath, chest pain, abdominal pain, problems with bowel movements, urination, or intercourse. No mood swings.Has body aches and is going to see Dr Laurian Brim'Toole, she has headaches and take advil daily and is complaining of not having orgasms.She has nexplanon, it was inserted 01/02/14.    Physical Exam:BP 110/80 mmHg  Pulse 96  Ht 5' 7.25" (1.708 m)  Wt 170 lb (77.111 kg)  BMI 26.43 kg/m2 BP on arrival was 136/92. General:  Well developed, well nourished, no acute distress Skin:  Warm and dry Neck:  Midline trachea, normal thyroid, good ROM, no lymphadenopathy Lungs; Clear to auscultation bilaterally Breast:  No dominant palpable mass, retraction, or nipple discharge Cardiovascular: Regular rate and rhythm Abdomen:  Soft, non tender, no hepatosplenomegaly Pelvic:  External genitalia is normal in appearance, no lesions.  The vagina is normal in appearance. Urethra has no lesions or masses. The cervix is bulbous.  Uterus is felt to be normal size, shape, and contour.  No adnexal masses or tenderness noted.Bladder is non tender, no masses felt. Extremities/musculoskeletal:  No swelling or varicosities noted, no clubbing or cyanosis Psych:  No mood changes, alert and cooperative,seems happy Discussed she may have rebound headaches to stop advil for 2 weeks, and try using warm wash cloth to clitoris area and using vibrator, talk with partner and use good lubricate.  Impression: Well woman gyn exam no pap  Nexplanon in  place STD testing    Plan: GC/CHL sent Check CBC,CMP,TSH and lipids, HIV,RPR and HSV2 Pap and physical in 1 year

## 2015-01-16 ENCOUNTER — Telehealth: Payer: Self-pay | Admitting: Adult Health

## 2015-01-16 LAB — CBC
HEMATOCRIT: 47 % — AB (ref 34.0–46.6)
Hemoglobin: 16.2 g/dL — ABNORMAL HIGH (ref 11.1–15.9)
MCH: 32.7 pg (ref 26.6–33.0)
MCHC: 34.5 g/dL (ref 31.5–35.7)
MCV: 95 fL (ref 79–97)
PLATELETS: 255 10*3/uL (ref 150–379)
RBC: 4.96 x10E6/uL (ref 3.77–5.28)
RDW: 12.9 % (ref 12.3–15.4)
WBC: 9.7 10*3/uL (ref 3.4–10.8)

## 2015-01-16 LAB — COMPREHENSIVE METABOLIC PANEL
A/G RATIO: 1.9 (ref 1.1–2.5)
ALT: 14 IU/L (ref 0–32)
AST: 11 IU/L (ref 0–40)
Albumin: 4.7 g/dL (ref 3.5–5.5)
Alkaline Phosphatase: 76 IU/L (ref 39–117)
BUN/Creatinine Ratio: 16 (ref 8–20)
BUN: 12 mg/dL (ref 6–20)
Bilirubin Total: 0.3 mg/dL (ref 0.0–1.2)
CO2: 22 mmol/L (ref 18–29)
Calcium: 10 mg/dL (ref 8.7–10.2)
Chloride: 100 mmol/L (ref 97–108)
Creatinine, Ser: 0.77 mg/dL (ref 0.57–1.00)
GFR calc non Af Amer: 102 mL/min/{1.73_m2} (ref >59)
GFR, EST AFRICAN AMERICAN: 117 mL/min/{1.73_m2} (ref >59)
GLUCOSE: 85 mg/dL (ref 65–99)
Globulin, Total: 2.5 g/dL (ref 1.5–4.5)
POTASSIUM: 4.4 mmol/L (ref 3.5–5.2)
Sodium: 138 mmol/L (ref 134–144)
Total Protein: 7.2 g/dL (ref 6.0–8.5)

## 2015-01-16 LAB — LIPID PANEL
Chol/HDL Ratio: 3.7 ratio units (ref 0.0–4.4)
Cholesterol, Total: 170 mg/dL (ref 100–199)
HDL: 46 mg/dL (ref >39)
LDL Calculated: 106 mg/dL — ABNORMAL HIGH (ref 0–99)
Triglycerides: 92 mg/dL (ref 0–149)
VLDL CHOLESTEROL CAL: 18 mg/dL (ref 5–40)

## 2015-01-16 LAB — TSH: TSH: 0.951 u[IU]/mL (ref 0.450–4.500)

## 2015-01-16 LAB — HIV ANTIBODY (ROUTINE TESTING W REFLEX): HIV SCREEN 4TH GENERATION: NONREACTIVE

## 2015-01-16 LAB — RPR: RPR: NONREACTIVE

## 2015-01-16 LAB — HSV 2 ANTIBODY, IGG: HSV 2 Glycoprotein G Ab, IgG: <0.91 index (ref 0.00–0.90)

## 2015-01-16 NOTE — Telephone Encounter (Signed)
Pt aware labs look good 

## 2015-01-17 LAB — GC/CHLAMYDIA PROBE AMP
CHLAMYDIA, DNA PROBE: NEGATIVE
Neisseria gonorrhoeae by PCR: NEGATIVE

## 2015-01-23 ENCOUNTER — Other Ambulatory Visit: Payer: Self-pay | Admitting: Obstetrics & Gynecology

## 2015-01-25 ENCOUNTER — Telehealth: Payer: Self-pay | Admitting: Family Medicine

## 2015-01-25 DIAGNOSIS — M542 Cervicalgia: Secondary | ICD-10-CM

## 2015-01-25 DIAGNOSIS — M549 Dorsalgia, unspecified: Secondary | ICD-10-CM

## 2015-01-25 NOTE — Telephone Encounter (Signed)
Ok lets do 

## 2015-01-25 NOTE — Telephone Encounter (Signed)
Pt was seen 01/12/14 by Dr. Romeo AppleHarrison for neck pain but has not been seen here since then for that issue

## 2015-01-25 NOTE — Telephone Encounter (Signed)
Patient is requesting a referral to Dr. Laurian Brim'toole due to neck/back pain.  She was diagnosed by Dr. Tiburcio PeaHarris to have fibromyalgia as well.  She said that because she has Medicaid she needs a referral.  She said she was seen for these issues by us in the past, so can we go ahead and do referral without her being seen?

## 2015-01-25 NOTE — Telephone Encounter (Signed)
Order for referral put in. Pt notified.  

## 2015-03-27 ENCOUNTER — Emergency Department (HOSPITAL_COMMUNITY): Payer: No Typology Code available for payment source

## 2015-03-27 ENCOUNTER — Encounter (HOSPITAL_COMMUNITY): Payer: Self-pay | Admitting: Emergency Medicine

## 2015-03-27 ENCOUNTER — Emergency Department (HOSPITAL_COMMUNITY)
Admission: EM | Admit: 2015-03-27 | Discharge: 2015-03-27 | Disposition: A | Discharge status: Home / Self Care | Payer: No Typology Code available for payment source | Attending: Emergency Medicine | Admitting: Emergency Medicine

## 2015-03-27 DIAGNOSIS — Y92410 Unspecified street and highway as the place of occurrence of the external cause: Secondary | ICD-10-CM | POA: Insufficient documentation

## 2015-03-27 DIAGNOSIS — Z8669 Personal history of other diseases of the nervous system and sense organs: Secondary | ICD-10-CM | POA: Diagnosis not present

## 2015-03-27 DIAGNOSIS — Z720 Tobacco use: Secondary | ICD-10-CM | POA: Insufficient documentation

## 2015-03-27 DIAGNOSIS — Z8659 Personal history of other mental and behavioral disorders: Secondary | ICD-10-CM | POA: Insufficient documentation

## 2015-03-27 DIAGNOSIS — S0990XA Unspecified injury of head, initial encounter: Secondary | ICD-10-CM | POA: Diagnosis not present

## 2015-03-27 DIAGNOSIS — Y998 Other external cause status: Secondary | ICD-10-CM | POA: Diagnosis not present

## 2015-03-27 DIAGNOSIS — Z8739 Personal history of other diseases of the musculoskeletal system and connective tissue: Secondary | ICD-10-CM | POA: Diagnosis not present

## 2015-03-27 DIAGNOSIS — S3992XA Unspecified injury of lower back, initial encounter: Secondary | ICD-10-CM | POA: Insufficient documentation

## 2015-03-27 DIAGNOSIS — G8929 Other chronic pain: Secondary | ICD-10-CM | POA: Diagnosis not present

## 2015-03-27 DIAGNOSIS — Y9389 Activity, other specified: Secondary | ICD-10-CM | POA: Diagnosis not present

## 2015-03-27 DIAGNOSIS — Z3202 Encounter for pregnancy test, result negative: Secondary | ICD-10-CM | POA: Diagnosis not present

## 2015-03-27 DIAGNOSIS — M545 Low back pain: Secondary | ICD-10-CM

## 2015-03-27 DIAGNOSIS — S199XXA Unspecified injury of neck, initial encounter: Secondary | ICD-10-CM | POA: Diagnosis present

## 2015-03-27 DIAGNOSIS — Z79899 Other long term (current) drug therapy: Secondary | ICD-10-CM | POA: Diagnosis not present

## 2015-03-27 DIAGNOSIS — M542 Cervicalgia: Secondary | ICD-10-CM

## 2015-03-27 HISTORY — DX: Headache: R51

## 2015-03-27 HISTORY — DX: Cervicalgia: M54.9

## 2015-03-27 HISTORY — DX: Cervicalgia: M54.2

## 2015-03-27 HISTORY — DX: Headache, unspecified: R51.9

## 2015-03-27 HISTORY — DX: Other chronic pain: G89.29

## 2015-03-27 HISTORY — DX: Pain in left shoulder: M25.512

## 2015-03-27 HISTORY — DX: Pain, unspecified: R52

## 2015-03-27 LAB — I-STAT BETA HCG BLOOD, ED (MC, WL, AP ONLY): I-stat hCG, quantitative: <5 m[IU]/mL (ref <5)

## 2015-03-27 MED ORDER — HYDROCODONE-ACETAMINOPHEN 5-325 MG PO TABS: ORAL_TABLET | ORAL | Status: DC

## 2015-03-27 MED ORDER — NAPROXEN 250 MG PO TABS: 250.0000 mg | ORAL_TABLET | Freq: Two times a day (BID) | ORAL | Status: DC | PRN

## 2015-03-27 MED ORDER — METHOCARBAMOL 500 MG PO TABS: 1000.0000 mg | ORAL_TABLET | Freq: Four times a day (QID) | ORAL | Status: DC | PRN

## 2015-03-27 MED ORDER — OXYCODONE-ACETAMINOPHEN 5-325 MG PO TABS
2.0000 | ORAL_TABLET | Freq: Once | ORAL | Status: AC
  Administered 2015-03-27: 2 via ORAL
  Filled 2015-03-27: qty 2

## 2015-03-27 MED ORDER — OXYCODONE-ACETAMINOPHEN 5-325 MG PO TABS: 1.0000 | ORAL_TABLET | Freq: Once | ORAL | Status: DC
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
## 2015-03-27 NOTE — ED Notes (Signed)
Pt states understanding of care given and follow-up instructions.  Ambulated out of ED with family member at side

## 2015-03-27 NOTE — ED Notes (Signed)
Pt assisted to bathroom and back to bed.  No other needs at this time

## 2015-03-27 NOTE — Discharge Instructions (Signed)
°Emergency Department Resource Guide °1) Find a Doctor and Pay Out of Pocket °Although you won't have to find out who is covered by your insurance plan, it is a good idea to ask around and get recommendations. You will then need to call the office and see if the doctor you have chosen will accept you as a new patient and what types of options they offer for patients who are self-pay. Some doctors offer discounts or will set up payment plans for their patients who do not have insurance, but you will need to ask so you aren't surprised when you get to your appointment. ° °2) Contact Your Local Health Department °Not all health departments have doctors that can see patients for sick visits, but many do, so it is worth a call to see if yours does. If you don't know where your local health department is, you can check in your phone book. The CDC also has a tool to help you locate your state's health department, and many state websites also have listings of all of their local health departments. ° °3) Find a Walk-in Clinic °If your illness is not likely to be very severe or complicated, you may want to try a walk in clinic. These are popping up all over the country in pharmacies, drugstores, and shopping centers. They're usually staffed by nurse practitioners or physician assistants that have been trained to treat common illnesses and complaints. They're usually fairly quick and inexpensive. However, if you have serious medical issues or chronic medical problems, these are probably not your best option. ° °No Primary Care Doctor: °- Call [facility] at  832-8000 - they can help you locate a primary care doctor that  accepts your insurance, provides certain services, etc. °- Physician Referral Service- 1-800-533-3463 ° °Chronic Pain Problems: °Organization         Address  Phone   Notes  °[facility]  (336) 297-2271 Patients need to be referred by their primary care doctor.  ° °Medication  Assistance: °Organization         Address  Phone   Notes  °[place] [facility] [address] °[place], [place] [address] (336) 641-8030 --Must be a resident of [place] °-- Must have NO insurance coverage whatsoever (no Medicaid/ Medicare, etc.) °-- The pt. MUST have a primary care doctor that directs their care regularly and follows them in the community °  °[facility]  (866) 331-1348   °[employer]  (888) 892-1162   ° °Agencies that provide inexpensive medical care: °Organization         Address  Phone   Notes  °[facility]  (336) 832-8035   °[facility]    (336) 832-7272   °[facility] [address] °[place], [place] [address] (336) 832-4777   °[facility] [address], °[place] (336) 271-4999   °[facility]    (336) 373-0678   °[facility]    (336) 272-1050   °[facility] ° [address], [place] Phone:  (336) 832-4444, Fax:  (336) 832-4440 Hours of Operation:  9 am - 6 pm, M-F.  Also accepts Medicaid/Medicare and self-pay.  °[facility] ° [address], [place] Phone: (336) 832-3150, Fax: (336) 832-3151. Hours of Operation:  8:30 am - 5:30 pm, M-F.  Also accepts Medicaid and self-pay.  °[facility] [place] [address], [place] Phone: (336) 878-6027   °[facility] [address], [place], [place] (336)723-1848, Ext. 123 Mondays & Thursdays: 7-9 AM.  First 15 patients are seen on a first come, first serve basis. °  ° °Medicaid-accepting [place] Providers: ° °Organization         Address  Phone   Notes  °[facility] [address], [place] (336) 641-2100 Also accepts self-pay patients.  °[facility] [address], [place] ° (336) 856-9996   °[facility] [address], [place]  (336) 288-8857   °[facility] [address] [place] [address], [place] (336) 299-7000   °Veita Bland [address], [place]  ° (336) 373-1557 Only accepts [place] Access Medicaid patients after they have their name applied to their card.  ° °Self-Pay (no insurance) in [place]: ° °Organization         Address  Phone   Notes  °Sickle Cell Patients, [facility] [address], [place] (336) 832-1970   °[facility] [address], [place] (336) 832-4400   ° [facility] ° [address], [place] (336) 992-4800   °[facility]/Dr. Osei-Bonsu ° [address] [place] [address], [place] or [address], [place] (336) 841-8500 Phone number for both [place] and [place] locations is the same.  °[facility] [address], [place] (336) 299-0000   °[facility] [address] [place] [address], [place] or [address] (336) 852-7530 °(336) 878-2260   °[facility] [address], [place] (336) 350-1642, phone; (336) 294-5005, fax Sees patients 1st and 3rd Saturday of every month.  Must not qualify for public or private insurance (i.e. Medicaid, Medicare, [place] Health Choice, Veterans' Benefits) • Household income should be no more than 200% of the poverty level •The clinic cannot treat you if you are pregnant or think you are pregnant • Sexually transmitted diseases are not treated at the clinic.  ° ° °Dental Care: °Organization         Address  Phone  Notes  °[place] [facility] [address], [place] (336) 641-6152 Accepts children up to age 21 who are enrolled in Medicaid or [place] Health Choice; pregnant women with a Medicaid card; and children who have applied for Medicaid or [place] Health Choice, but were declined, whose parents can pay a reduced fee at time of service.  °[place]  [facility] [place]  [address], [place] (336) 641-7733 Accepts children up to age 21 who are enrolled in Medicaid or [place] Health Choice; pregnant women with a Medicaid card; and children who have applied for Medicaid or  Health Choice, but were declined, whose parents can pay a reduced fee at time of service.  °[facility] ° [address],  (336) 641-4533 Patients are seen by appointment only. Walk-ins are not accepted. [facility] will see patients 18 years of age and older. °Monday - Tuesday (8am-5pm) °Most Wednesdays (8:30-5pm) °$30 per visit, cash only  °[facility] ° [address], [place] (336) 641-4533 Patients are seen by appointment only. Walk-ins are not accepted. [facility] will see patients 18 years of age and older. °One   Wednesday Evening (Monthly: Volunteer Based).  $30 per visit, cash only  °[facility]  (919) 537-3737 for adults; Children under age 4, call Graduate Pediatric Dentistry at (919) [phone]. Children aged 4-14, please call (919) [phone] to request a pediatric application. ° Dental services are provided in all areas of dental care including fillings, crowns and bridges, complete and partial dentures, implants, gum treatment, root canals, and extractions. Preventive care is also provided. Treatment is provided to both adults and children. °Patients are selected via a lottery and there is often a waiting list. °  °[facility] [address], °[place] ° (336) 763-8833 [email or web address] °  °[facility] [address], [place], [place] (336)723-1848, Ext. 123 Second and Fourth Thursday of each month, opens at 6:30 AM; Clinic ends at 9 AM.  Patients are seen on a first-come first-served basis, and a limited number are seen during each clinic.  ° °[facility] ° [address], [place], [place] (336) 723-7904    Eligibility Requirements °You must have lived in [place], [place], or [place] counties for at least the last three months. °  You cannot be eligible for state or federal sponsored healthcare insurance, including Veterans Administration, Medicaid, or Medicare. °  You generally cannot be eligible for healthcare insurance through your employer.  °  How to apply: °Eligibility screenings are held every Tuesday and Wednesday afternoon from 1:00 pm until 4:00 pm. You do not need an appointment for the interview!  °[facility] [address], [place], [place] 336-631-2330   °[facility]  336-342-8273   °[facility]  336-703-3100   °[facility]  336-570-6415   ° °Behavioral Health Resources in the Community: °Intensive Outpatient Programs °Organization         Address  Phone  Notes  °[facility] [address], [place], [place] 336-878-6098   °[facility] [address], [place], [place] 336-832-9800   °[facility] [address], [place], [place] ° 336-882-2125   °[facility] [address],  °[place], [place] 1-800-853-5163 or 336-641-4981   °Substance Abuse Resources °Organization         Address  Phone  Notes  °[facility]  336-882-2125   °[facility]  336-784-9470   °[facility]  336-285-9073   °[facility]  336-845-3988   °Residential & Outpatient Substance Abuse Program  1-800-659-3381   °Psychological Services °Organization         Address  Phone  Notes  °[facility]  336- 832-9600   °[facility]  336- 378-7881   °[facility] [address], [place] 1-800-853-5163 or 336-641-4981   ° °Mobile Crisis Teams °Organization         Address  Phone  Notes  °[facility], Mobile Crisis Care Unit  1-877-626-1772   °Assertive °[facility] ° [address]  [place], [place] 336-834-9664   °Sharon DeEsch [address] °[place] 336-554-5454   ° °Self-Help/Support Groups °Organization         Address  Phone             Notes  °[facility] - variety of support groups  336- 373-1402 Call for more information  °Narcotics Anonymous (NA), [facility] [address], °[place]   2 meetings at this location  ° °  Residential Treatment Programs °Organization         Address  Phone  Notes  °[facility] [address],    °[place]  1-866-801-8205   °[facility] ° [address], [place], [place] 704-293-8524   °[facility] [address], [place] 336-845-3988 Admissions: 8am-3pm M-F  °[facility] [address]    °[place], [place] 336-841-1104   °[facility] [address], [place], [place] 336-379-7146   °[facility] [address]  °[place], [place] 336-285-9073   °[facility] - Intensive Outpatient [address], [place], [place] 336-852-3033   °[facility] [address]  °[place], [place] 1-877-615-2722 or 336-784-9470   °[facility] [address] [place], [place] 336-227-7417 Accepts Medicaid  °[facility] [address]  °[place] 1-800-659-3381 Substance Abuse/Addiction Treatment  ° °[place] Behavioral Health Resources °Organization         Address  Phone  Notes  °[facility]  (888) 581-9988   °Julie Brannon, PhD [address] [place], [place]   (336) 349-5553 or (336) 951-0000   ° Behavioral   [address] °[place], [place] (336) 349-4454   °[facility] [address], [place], [place] (336) 342-8316 Insurance/Medicaid/sponsorship through [facility]  °[facility] [address]                                    [place], [place] (336) 342-8316 Therapy/tele-psych/case    °[facility] [address]  ° [place], [place] (336) 349-2233    °Dr. Arfeen  (336) 349-4544   °[facility] [place]  [employer] [place] [facility] 1) [address], [place] °2) [address], [place] °3)  [address], [place] (336) 349-3220 °(336) 342-7768 ° °(336) 342-8140   °[place] [facility] (336) 342-1394 or (336) 342-3537 (After Hours)    ° °Take the prescriptions as directed.  Apply moist heat or ice to the area(s) of discomfort, for 15 minutes at a time, several times per day for the next few days.  Do not fall asleep on a heating or ice pack.  Call your regular medical doctor tomorrow to schedule a follow up appointment in the next 2 days.  Return to the Emergency Department immediately if worsening. ° °
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
## 2015-10-09 ENCOUNTER — Encounter: Payer: Self-pay | Admitting: Family Medicine

## 2016-10-22 ENCOUNTER — Encounter: Payer: Self-pay | Admitting: Obstetrics and Gynecology

## 2016-10-22 ENCOUNTER — Other Ambulatory Visit (HOSPITAL_COMMUNITY)
Admission: RE | Admit: 2016-10-22 | Discharge: 2016-10-22 | Disposition: A | Discharge status: Home / Self Care | Payer: Medicaid Other | Source: Ambulatory Visit | Attending: Obstetrics and Gynecology | Admitting: Obstetrics and Gynecology

## 2016-10-22 ENCOUNTER — Ambulatory Visit (INDEPENDENT_AMBULATORY_CARE_PROVIDER_SITE_OTHER): Payer: Medicaid Other | Admitting: Obstetrics and Gynecology

## 2016-10-22 VITALS — BP 132/84 | HR 80 | Ht 67.0 in | Wt 181.8 lb

## 2016-10-22 DIAGNOSIS — A5901 Trichomonal vulvovaginitis: Secondary | ICD-10-CM | POA: Insufficient documentation

## 2016-10-22 DIAGNOSIS — Z1389 Encounter for screening for other disorder: Secondary | ICD-10-CM

## 2016-10-22 DIAGNOSIS — Z308 Encounter for other contraceptive management: Secondary | ICD-10-CM

## 2016-10-22 DIAGNOSIS — Z3009 Encounter for other general counseling and advice on contraception: Secondary | ICD-10-CM

## 2016-10-22 DIAGNOSIS — Z1151 Encounter for screening for human papillomavirus (HPV): Secondary | ICD-10-CM | POA: Insufficient documentation

## 2016-10-22 DIAGNOSIS — Z113 Encounter for screening for infections with a predominantly sexual mode of transmission: Secondary | ICD-10-CM | POA: Insufficient documentation

## 2016-10-22 DIAGNOSIS — Z01419 Encounter for gynecological examination (general) (routine) without abnormal findings: Secondary | ICD-10-CM | POA: Insufficient documentation

## 2016-10-22 MED ORDER — METRONIDAZOLE 500 MG PO TABS: 500.0000 mg | ORAL_TABLET | Freq: Two times a day (BID) | ORAL | 1 refills | Status: DC

## 2016-10-22 NOTE — Progress Notes (Addendum)
  Assessment:  Annual Gyn Exam  trich vaginitis sti screen Plan:  1. pap smear done, next pap due 3 yr 2. return annually or prn 3    Mammogram advised for 5 years  rx Metronidazole Subjective:  Chelsea Cortez is a 36 y.o. female G2P2 who presents for annual exam. Patient's last menstrual period was 10/06/2016 (approximate). Pt is here today for family planing visit and pap smear. At this time the patient has complaints of yellow malodorous vaginal discharge x a few weeks with associated vaginal itching and irritation. No alleviating factors noted.  She denies h/o DM. Pt is sexually active with a new partner (2 months). She has an Implanon implant in the LUE.   The following portions of the patient's history were reviewed and updated as appropriate: allergies, current medications, past family history, past medical history, past social history, past surgical history and problem list.  Past Medical History:  Diagnosis Date  . Anxiety   . Chronic left shoulder pain   . Chronic neck and back pain   . Farsightedness   . Fibromyalgia   . Headache   . Nexplanon in place 01/15/2015   Got nexplanon inserted 01/02/14 in left arm  . Pain management 2015    History reviewed. No pertinent surgical history.   Current Outpatient Prescriptions:  .  Etonogestrel (NEXPLANON [place]), Inject 1 Device into the skin., Disp: , Rfl:  .  ibuprofen (ADVIL,MOTRIN) 800 MG tablet, Take 800 mg by mouth every 8 (eight) hours as needed for moderate pain., Disp: , Rfl:  .  montelukast (SINGULAIR) 10 MG tablet, Take 10 mg by mouth daily., Disp: , Rfl:   Review of Systems Otherwise negative for acute change except as noted in the HPI.  Objective:  BP 132/84 (BP Location: Right Arm, Patient Position: Sitting, Cuff Size: Normal)   Pulse 80   Ht 5\' 7"  (1.702 m)   Wt 181 lb 12.8 oz (82.5 kg)   LMP 10/06/2016 (Approximate)   BMI 28.47 kg/m    BMI: Body mass index is 28.47 kg/m.  General Appearance: Alert,  appropriate appearance for age. No acute distress HEENT: Grossly normal Neck / Thyroid:  Cardiovascular: RRR; normal S1, S2, no murmur Lungs: CTA bilaterally Back: No CVAT Breast Exam: No dimpling, nipple retraction or discharge. No masses or nodes. and No masses or nodes.No dimpling, nipple retraction or discharge. Gastrointestinal: Soft, non-tender, no masses or organomegaly Pelvic Exam: External genitalia: normal general appearance Vaginal: moderate amount of discharge  Cervix: normal appearance PAP: Pap smear done today [place] done - results: KOH done, DNA probe for chlamydia and GC obtained. Lymphatic Exam: Non-palpable nodes in neck, clavicular, axillary, or inguinal regions  Skin: no rash or abnormalities Neurologic: Normal gait and speech, no tremor  Psychiatric: Alert and oriented, appropriate affect.  Urinalysis:Not done  PLAN: Will treat trichomonas with Metronidazole. Expedited partner treatment; script written for partner.    By signing my name below, I, Freida BusmanDiana Omoyeni, attest that this documentation has been prepared under the direction and in the presence of Tilda BurrowJohn V Cara Thaxton, MD . Electronically Signed: Freida Busmaniana Omoyeni, Scribe. 10/22/2016. 3:16 PM. I personally performed the services described in this documentation, which was SCRIBED in my presence. The recorded information has been reviewed and considered accurate. It has been edited as necessary during review. Tilda BurrowFERGUSON,Gerhart Ruggieri V, MD

## 2016-10-23 LAB — POCT URINALYSIS DIPSTICK
Glucose, UA: NEGATIVE
Ketones, UA: NEGATIVE
Leukocytes, UA: NEGATIVE
Nitrite, UA: NEGATIVE
Protein, UA: NEGATIVE
RBC UA: NEGATIVE

## 2016-10-24 LAB — CYTOLOGY - PAP
Chlamydia: NEGATIVE
DIAGNOSIS: NEGATIVE
HPV: NOT DETECTED
NEISSERIA GONORRHEA: NEGATIVE

## 2016-11-23 ENCOUNTER — Telehealth: Payer: Self-pay | Admitting: Obstetrics and Gynecology

## 2016-11-28 NOTE — Telephone Encounter (Signed)
Pt asked to contact office to clarify any followup needed from our office.  My review of records does not indicate any unfinished care issues.

## 2017-03-05 ENCOUNTER — Encounter: Payer: Self-pay | Admitting: Women's Health

## 2017-03-05 ENCOUNTER — Ambulatory Visit (INDEPENDENT_AMBULATORY_CARE_PROVIDER_SITE_OTHER): Payer: Medicaid Other | Admitting: Women's Health

## 2017-03-05 VITALS — BP 98/66 | HR 70 | Ht 69.0 in | Wt 180.0 lb

## 2017-03-05 DIAGNOSIS — Z3049 Encounter for surveillance of other contraceptives: Secondary | ICD-10-CM | POA: Diagnosis not present

## 2017-03-05 DIAGNOSIS — Z30017 Encounter for initial prescription of implantable subdermal contraceptive: Secondary | ICD-10-CM

## 2017-03-05 DIAGNOSIS — Z3202 Encounter for pregnancy test, result negative: Secondary | ICD-10-CM | POA: Diagnosis not present

## 2017-03-05 DIAGNOSIS — Z3046 Encounter for surveillance of implantable subdermal contraceptive: Secondary | ICD-10-CM

## 2017-03-05 LAB — POCT URINE PREGNANCY: Preg Test, Ur: NEGATIVE

## 2017-03-05 NOTE — Progress Notes (Signed)
Chelsea Cortez is a 36 y.o. year old 12P2002 Caucasian female here for Nexplanon removal and reinsertion.  She was given informed consent for removal and reinsertion of her Nexplanon. Her Nexplanon was placed 01/02/14, Patient's last menstrual period was 02/20/2017 (approximate)., and her pregnancy test today was neg. She was tx for trichomonas in Feb, took all meds, never came back for poc. No sx, no longer w/ that partner. Declines poc.   Risks/benefits/side effects of Nexplanon have been discussed and her questions have been answered.  Specifically, a failure rate of 08/998 has been reported, with an increased failure rate if pt takes St. John's Wort and/or antiseizure medicaitons.  Chelsea Cortez is aware of the common side effect of irregular bleeding, which the incidence of decreases over time.  BP 98/66 (BP Location: Left Arm, Patient Position: Sitting, Cuff Size: Normal)   Pulse 70   Ht 5\' 9"  (1.753 m)   Wt 180 lb (81.6 kg)   LMP 02/20/2017 (Approximate)   BMI 26.58 kg/m  Patient's last menstrual period was 02/20/2017 (approximate). Results for orders placed or performed in visit on 03/05/17 (from the past 24 hour(s))  POCT urine pregnancy   Collection Time: 03/05/17  9:06 AM  Result Value Ref Range   Preg Test, Ur Negative Negative     Appropriate time out taken. Nexplanon site identified.  Area prepped in usual sterile fashon. Two cc's of 2% lidocaine was used to anesthetize the area. A small stab incision was made right beside the implant on the distal portion.  The Nexplanon rod was grasped using hemostats and removed intact without difficulty.  The area was cleansed again with betadine and the Nexplanon was inserted per manufacturer's recommendations without difficulty.  Steri-strips and a pressure bandage was applied.  There was less than 3 cc blood loss. There were no complications.  The patient tolerated the procedure well.  She was instructed to keep the area clean and dry,  remove pressure bandage in 24 hours, and keep insertion site covered with the steri-strips for 3-5 days.  She was given a card indicating date Nexplanon was inserted and date it needs to be removed.   Follow-up PRN problems, then after 2/28 for physical  Marge DuncansBooker, Ailine Hefferan Randall CNM, [facility] Center[facility]NP-BC 03/05/2017 9:14 AM

## 2017-03-05 NOTE — Patient Instructions (Signed)

## 2019-08-02 ENCOUNTER — Other Ambulatory Visit: Payer: Self-pay | Admitting: Obstetrics and Gynecology

## 2019-09-13 ENCOUNTER — Encounter: Payer: Self-pay | Admitting: Obstetrics and Gynecology

## 2019-09-13 ENCOUNTER — Other Ambulatory Visit: Payer: Self-pay

## 2019-09-13 ENCOUNTER — Ambulatory Visit (INDEPENDENT_AMBULATORY_CARE_PROVIDER_SITE_OTHER): Payer: Medicaid Other | Admitting: Obstetrics and Gynecology

## 2019-09-13 ENCOUNTER — Other Ambulatory Visit (HOSPITAL_COMMUNITY)
Admission: RE | Admit: 2019-09-13 | Discharge: 2019-09-13 | Disposition: A | Discharge status: Home / Self Care | Payer: Medicaid Other | Source: Ambulatory Visit | Attending: Obstetrics and Gynecology | Admitting: Obstetrics and Gynecology

## 2019-09-13 VITALS — BP 124/79 | HR 96 | Ht 68.0 in | Wt 180.8 lb

## 2019-09-13 DIAGNOSIS — Z0000 Encounter for general adult medical examination without abnormal findings: Secondary | ICD-10-CM

## 2019-09-13 DIAGNOSIS — Z01419 Encounter for gynecological examination (general) (routine) without abnormal findings: Secondary | ICD-10-CM | POA: Diagnosis not present

## 2019-09-13 NOTE — Progress Notes (Signed)
  Assessment:  Annual Gyn Exam Counseling regarding risks of pregnancy and genetic issues at age 39 Plan:  1. pap smear done, next pap due 5 years 2. return annually or prn 3    Annual mammogram advised after age 75 Subjective:  Chelsea Cortez is a 39 y.o. female G22P2002 who presents for annual exam. No LMP recorded. Patient has had an implant. The patient has complaints today of none.  She is in overall excellent health, and is in a relationship where they are discussing having of the child.  She has had 2 children that are both teenagers by prior relationship.  The current partner has no children.  She is seems anxious slightly but excited about the possibility of having a child with this new partner.  We reviewed the statistical risks of trisomies at age 39, and mentions the ability of genetic testing  The following portions of the patient's history were reviewed and updated as appropriate: allergies, current medications, past family history, past medical history, past social history, past surgical history and problem list. Past Medical History:  Diagnosis Date  . Anxiety   . Chronic left shoulder pain   . Chronic neck and back pain   . Farsightedness   . Fibromyalgia   . Headache   . Nexplanon in place 01/15/2015   Got nexplanon inserted 01/02/14 in left arm  . Pain management 2015    History reviewed. No pertinent surgical history.   Current Outpatient Medications:  .  Etonogestrel (NEXPLANON [place]), Inject 1 Device into the skin., Disp: , Rfl:  .  ibuprofen (ADVIL,MOTRIN) 800 MG tablet, Take 800 mg by mouth every 8 (eight) hours as needed for moderate pain., Disp: , Rfl:   Review of Systems Constitutional: negative Gastrointestinal: negative Genitourinary: Negative  Objective:  BP 124/79 (BP Location: Left Arm, Patient Position: Sitting, Cuff Size: Normal)   Pulse 96   Ht 5\' 8"  (1.727 m)   Wt 180 lb 12.8 oz (82 kg)   BMI 27.49 kg/m    BMI: Body mass index is 27.49 kg/m.   General Appearance: Alert, appropriate appearance for age. No acute distress HEENT: Grossly normal Neck / Thyroid:  Cardiovascular: RRR; normal S1, S2, no murmur Lungs: CTA bilaterally Back: No CVAT Breast Exam: No dimpling, nipple retraction or discharge. No masses or nodes., Normal to inspection, Normal breast tissue bilaterally and No masses or nodes.No dimpling, nipple retraction or discharge. Gastrointestinal: Soft, non-tender, no masses or organomegaly Pelvic Exam: Vulva and vagina appear normal. Bimanual exam reveals normal uterus and adnexa. Rectovaginal: not indicated Lymphatic Exam: Non-palpable nodes in neck, clavicular, axillary, or inguinal regions Skin: no rash or abnormalities Neurologic: Normal gait and speech, no tremor  Psychiatric: Alert and oriented, appropriate affect.  Urinalysis:Not done  . MD Pgr [phone] 5:10 PM

## 2019-09-14 LAB — HIV ANTIBODY (ROUTINE TESTING W REFLEX): HIV Screen 4th Generation wRfx: NONREACTIVE

## 2019-09-14 LAB — RPR: RPR Ser Ql: NONREACTIVE

## 2019-09-16 LAB — CYTOLOGY - PAP
Chlamydia: NEGATIVE
Comment: NEGATIVE
Comment: NEGATIVE
Comment: NORMAL
Diagnosis: NEGATIVE
High risk HPV: NEGATIVE
Neisseria Gonorrhea: NEGATIVE

## 2019-09-26 ENCOUNTER — Encounter: Payer: Self-pay | Admitting: Family Medicine

## 2020-03-06 ENCOUNTER — Ambulatory Visit (INDEPENDENT_AMBULATORY_CARE_PROVIDER_SITE_OTHER): Payer: Medicaid Other | Admitting: Women's Health

## 2020-03-06 ENCOUNTER — Encounter: Payer: Self-pay | Admitting: Women's Health

## 2020-03-06 VITALS — BP 121/75 | HR 82 | Ht 69.0 in | Wt 174.0 lb

## 2020-03-06 DIAGNOSIS — Z3202 Encounter for pregnancy test, result negative: Secondary | ICD-10-CM

## 2020-03-06 DIAGNOSIS — Z3046 Encounter for surveillance of implantable subdermal contraceptive: Secondary | ICD-10-CM | POA: Diagnosis not present

## 2020-03-06 LAB — POCT URINE PREGNANCY: Preg Test, Ur: NEGATIVE

## 2020-03-06 NOTE — Patient Instructions (Signed)
Start prenatal vitamins today Work on quitting smoking Let us know if you get a positive pregnancy test

## 2020-03-06 NOTE — Progress Notes (Signed)
° °  NEXPLANON REMOVAL Patient name: Chelsea Cortez MRN [id number]  Date of birth: 12-18-1980 Subjective Findings:   Chelsea Cortez is a 39 y.o. G60P2002 Caucasian female being seen today for removal of a Nexplanon. Her Nexplanon was placed 03/05/17.  She desires removal because she wants to get pregnant. In a relationship w/ a man who doesn't have children, and wants to. States she is going to give it 38yr, and if not pregnant wants nexplanon put back in. Smokes 1/2ppd. No drugs/etoh. No chronic diseases/medicines. Some nausea lately, wants to check a UPT. Signed copy of informed consent in chart.   No LMP recorded. Patient has had an implant. Last pap1/19/21. Results were:  normal The planned method of family planning is none No flowsheet data found.  Pertinent History Reviewed:   Reviewed past medical,surgical, social, obstetrical and family history.  Reviewed problem list, medications and allergies. Objective Findings & Procedure:    Vitals:   03/06/20 1501  BP: 121/75  Pulse: 82  Weight: 174 lb (78.9 kg)  Height: 5\' 9"  (1.753 m)  Body mass index is 25.7 kg/m.  Results for orders placed or performed in visit on 03/06/20 (from the past 24 hour(s))  POCT urine pregnancy   Collection Time: 03/06/20  3:35 PM  Result Value Ref Range   Preg Test, Ur Negative Negative     Time out was performed.  Nexplanon site identified.  Area prepped in usual sterile fashon. One cc of 2% lidocaine was used to anesthetize the area at the distal end of the implant. A small stab incision was made right beside the implant on the distal portion.  The Nexplanon rod was grasped using hemostats and removed without difficulty.  There was less than 3 cc blood loss. There were no complications.  Steri-strips were applied over the small incision and a pressure bandage was applied.  The patient tolerated the procedure well. Assessment & Plan:   1) Nexplanon removal She was instructed to keep the area clean and  dry, remove pressure bandage in 24 hours, and keep insertion site covered with the steri-strip for 3-5 days.   Follow-up PRN problems.  2) Desires pregnancy> start pnv today, let 03/08/20 know if gets +HPT  Orders Placed This Encounter  Procedures   POCT urine pregnancy    Follow-up: Return for after 09/12/20 for , Physical.  09/14/20 CNM, [facility] 03/06/2020 3:59 PM

## 2020-04-23 ENCOUNTER — Other Ambulatory Visit (HOSPITAL_COMMUNITY)
Admission: RE | Admit: 2020-04-23 | Discharge: 2020-04-23 | Disposition: A | Discharge status: Home / Self Care | Payer: Medicaid Other | Source: Ambulatory Visit | Attending: Obstetrics and Gynecology | Admitting: Obstetrics and Gynecology

## 2020-04-23 ENCOUNTER — Encounter: Payer: Self-pay | Admitting: Women's Health

## 2020-04-23 ENCOUNTER — Ambulatory Visit (INDEPENDENT_AMBULATORY_CARE_PROVIDER_SITE_OTHER): Payer: Medicaid Other | Admitting: Women's Health

## 2020-04-23 VITALS — BP 121/74 | HR 103 | Ht 69.0 in | Wt 172.0 lb

## 2020-04-23 DIAGNOSIS — Z3046 Encounter for surveillance of implantable subdermal contraceptive: Secondary | ICD-10-CM

## 2020-04-23 DIAGNOSIS — Z30017 Encounter for initial prescription of implantable subdermal contraceptive: Secondary | ICD-10-CM

## 2020-04-23 DIAGNOSIS — N898 Other specified noninflammatory disorders of vagina: Secondary | ICD-10-CM | POA: Diagnosis present

## 2020-04-23 DIAGNOSIS — Z3202 Encounter for pregnancy test, result negative: Secondary | ICD-10-CM | POA: Diagnosis not present

## 2020-04-23 DIAGNOSIS — Z113 Encounter for screening for infections with a predominantly sexual mode of transmission: Secondary | ICD-10-CM | POA: Diagnosis not present

## 2020-04-23 LAB — POCT URINE PREGNANCY: Preg Test, Ur: NEGATIVE

## 2020-04-23 MED ORDER — ETONOGESTREL 68 MG ~~LOC~~ IMPL
68.0000 mg | DRUG_IMPLANT | Freq: Once | SUBCUTANEOUS | Status: AC
  Administered 2020-04-23: 68 mg via SUBCUTANEOUS

## 2020-04-23 NOTE — Addendum Note (Signed)
Addended by: Annamarie Dawley on: 04/23/2020 12:59 PM   Modules accepted: Orders

## 2020-04-23 NOTE — Progress Notes (Signed)
   NEXPLANON INSERTION Patient name: Chelsea Cortez MRN [id number]  Date of birth: May 06, 1981 Subjective Findings:   Chelsea Cortez is a 39 y.o. G41P2002 Caucasian female being seen today for insertion of a Nexplanon. New sex partner, has some vaginal odor, no itching/irritation.  Patient's last menstrual period was 04/16/2020. Last sexual intercourse was 8/21 Last pap1/19/21. Results were:  normal  Risks/benefits/side effects of Nexplanon have been discussed and her questions have been answered.  Specifically, a failure rate of 08/998 has been reported, with an increased failure rate if pt takes St. John's Wort and/or antiseizure medicaitons.  She is aware of the common side effect of irregular bleeding, which the incidence of decreases over time. Signed copy of informed consent in chart.  No flowsheet data found.  Pertinent History Reviewed:   Reviewed past medical,surgical, social, obstetrical and family history.  Reviewed problem list, medications and allergies. Objective Findings & Procedure:    Vitals:   04/23/20 1147  BP: 121/74  Pulse: (!) 103  Weight: 172 lb (78 kg)  Height: 5\' 9"  (1.753 m)  Body mass index is 25.4 kg/m.  Results for orders placed or performed in visit on 04/23/20 (from the past 24 hour(s))  POCT urine pregnancy   Collection Time: 04/23/20 11:53 AM  Result Value Ref Range   Preg Test, Ur Negative Negative     Time out was performed.  She is right-handed, so her left arm, approximately 10cm from the medial epicondyle and 3-5cm posterior to the sulcus, was cleansed with alcohol and anesthetized with 2cc of 2% Lidocaine.  The area was cleansed again with betadine and the Nexplanon was inserted per manufacturer's recommendations without difficulty.  3 steri-strips and pressure bandage were applied. The patient tolerated the procedure well.   Pt self-collected CV swab Assessment & Plan:   1) Nexplanon insertion Pt was instructed to keep the area clean and  dry, remove pressure bandage in 24 hours, and keep insertion site covered with the steri-strip for 3-5 days.  Back up contraception was recommended for 2 weeks.  She was given a card indicating date Nexplanon was inserted and date it needs to be removed. Follow-up PRN problems.  2) Vag odor/STD screen> CV swab sent  Orders Placed This Encounter  Procedures  . POCT urine pregnancy    Follow-up: Return for after 1/19 for physical.  2/19 CNM, [facility] 04/23/2020 12:28 PM

## 2020-04-23 NOTE — Patient Instructions (Signed)
Keep the area clean and dry.  You can remove the big bandage in 24 hours, and the small steri-strip bandage in 3-5 days.  A back up method, such as condoms, should be used for two weeks. You may have irregular vaginal bleeding for the first 6 months after the Nexplanon is placed, then the bleeding usually lightens and it is possible that you may not have any periods.  If you have any concerns, please give us a call.    Etonogestrel implant What is this medicine? ETONOGESTREL (et oh noe JES trel) is a contraceptive (birth control) device. It is used to prevent pregnancy. It can be used for up to 3 years. This medicine may be used for other purposes; ask your health care provider or pharmacist if you have questions. COMMON BRAND NAME(S): Implanon, Nexplanon What should I tell my health care provider before I take this medicine? They need to know if you have any of these conditions:  abnormal vaginal bleeding  blood vessel disease or blood clots  breast, cervical, endometrial, ovarian, liver, or uterine cancer  diabetes  gallbladder disease  heart disease or recent heart attack  high blood pressure  high cholesterol or triglycerides  kidney disease  liver disease  migraine headaches  seizures  stroke  tobacco smoker  an unusual or allergic reaction to etonogestrel, anesthetics or antiseptics, other medicines, foods, dyes, or preservatives  pregnant or trying to get pregnant  breast-feeding How should I use this medicine? This device is inserted just under the skin on the inner side of your upper arm by a health care professional. Talk to your pediatrician regarding the use of this medicine in children. Special care may be needed. Overdosage: If you think you have taken too much of this medicine contact a poison control center or emergency room at once. NOTE: This medicine is only for you. Do not share this medicine with others. What if I miss a dose? This does not  apply. What may interact with this medicine? Do not take this medicine with any of the following medications:  amprenavir  fosamprenavir This medicine may also interact with the following medications:  acitretin  aprepitant  armodafinil  bexarotene  bosentan  carbamazepine  certain medicines for fungal infections like fluconazole, ketoconazole, itraconazole and voriconazole  certain medicines to treat hepatitis, HIV or AIDS  cyclosporine  felbamate  griseofulvin  lamotrigine  modafinil  oxcarbazepine  phenobarbital  phenytoin  primidone  rifabutin  rifampin  rifapentine  St. John's wort  topiramate This list may not describe all possible interactions. Give your health care provider a list of all the medicines, herbs, non-prescription drugs, or dietary supplements you use. Also tell them if you smoke, drink alcohol, or use illegal drugs. Some items may interact with your medicine. What should I watch for while using this medicine? This product does not protect you against HIV infection (AIDS) or other sexually transmitted diseases. You should be able to feel the implant by pressing your fingertips over the skin where it was inserted. Contact your doctor if you cannot feel the implant, and use a non-hormonal birth control method (such as condoms) until your doctor confirms that the implant is in place. Contact your doctor if you think that the implant may have broken or become bent while in your arm. You will receive a user card from your health care provider after the implant is inserted. The card is a record of the location of the implant in your upper arm   and when it should be removed. Keep this card with your health records. What side effects may I notice from receiving this medicine? Side effects that you should report to your doctor or health care professional as soon as possible:  allergic reactions like skin rash, itching or hives, swelling of the  face, lips, or tongue  breast lumps, breast tissue changes, or discharge  breathing problems  changes in emotions or moods  coughing up blood  if you feel that the implant may have broken or bent while in your arm  high blood pressure  pain, irritation, swelling, or bruising at the insertion site  scar at site of insertion  signs of infection at the insertion site such as fever, and skin redness, pain or discharge  signs and symptoms of a blood clot such as breathing problems; changes in vision; chest pain; severe, sudden headache; pain, swelling, warmth in the leg; trouble speaking; sudden numbness or weakness of the face, arm or leg  signs and symptoms of liver injury like dark yellow or brown urine; general ill feeling or flu-like symptoms; light-colored stools; loss of appetite; nausea; right upper belly pain; unusually weak or tired; yellowing of the eyes or skin  unusual vaginal bleeding, discharge Side effects that usually do not require medical attention (report to your doctor or health care professional if they continue or are bothersome):  acne  breast pain or tenderness  headache  irregular menstrual bleeding  nausea This list may not describe all possible side effects. Call your doctor for medical advice about side effects. You may report side effects to FDA at 1-800-FDA-1088. Where should I keep my medicine? This drug is given in a hospital or clinic and will not be stored at home. NOTE: This sheet is a summary. It may not cover all possible information. If you have questions about this medicine, talk to your doctor, pharmacist, or health care provider.  2020 Elsevier/Gold Standard (2019-05-24 11:33:04)  

## 2020-04-24 ENCOUNTER — Other Ambulatory Visit: Payer: Self-pay | Admitting: Women's Health

## 2020-04-24 LAB — CERVICOVAGINAL ANCILLARY ONLY
Bacterial Vaginitis (gardnerella): POSITIVE — AB
Candida Glabrata: NEGATIVE
Candida Vaginitis: NEGATIVE
Chlamydia: NEGATIVE
Comment: NEGATIVE
Comment: NEGATIVE
Comment: NEGATIVE
Comment: NEGATIVE
Comment: NEGATIVE
Comment: NORMAL
Neisseria Gonorrhea: NEGATIVE
Trichomonas: NEGATIVE

## 2020-04-24 MED ORDER — METRONIDAZOLE 500 MG PO TABS: 500.0000 mg | ORAL_TABLET | Freq: Two times a day (BID) | ORAL | 0 refills | Status: DC

## 2021-10-01 ENCOUNTER — Encounter: Payer: Self-pay | Admitting: Women's Health

## 2021-10-01 ENCOUNTER — Other Ambulatory Visit (HOSPITAL_COMMUNITY)
Admission: RE | Admit: 2021-10-01 | Discharge: 2021-10-01 | Disposition: A | Discharge status: Home / Self Care | Payer: Medicaid Other | Source: Ambulatory Visit | Attending: Women's Health | Admitting: Women's Health

## 2021-10-01 ENCOUNTER — Other Ambulatory Visit: Payer: Self-pay

## 2021-10-01 ENCOUNTER — Ambulatory Visit (INDEPENDENT_AMBULATORY_CARE_PROVIDER_SITE_OTHER): Payer: 59 | Admitting: Women's Health

## 2021-10-01 VITALS — BP 121/79 | HR 101 | Ht 67.5 in | Wt 205.0 lb

## 2021-10-01 DIAGNOSIS — Z833 Family history of diabetes mellitus: Secondary | ICD-10-CM

## 2021-10-01 DIAGNOSIS — Z01419 Encounter for gynecological examination (general) (routine) without abnormal findings: Secondary | ICD-10-CM | POA: Diagnosis not present

## 2021-10-01 DIAGNOSIS — Z131 Encounter for screening for diabetes mellitus: Secondary | ICD-10-CM | POA: Diagnosis not present

## 2021-10-01 DIAGNOSIS — Z113 Encounter for screening for infections with a predominantly sexual mode of transmission: Secondary | ICD-10-CM

## 2021-10-01 DIAGNOSIS — Z1231 Encounter for screening mammogram for malignant neoplasm of breast: Secondary | ICD-10-CM

## 2021-10-01 DIAGNOSIS — R1310 Dysphagia, unspecified: Secondary | ICD-10-CM

## 2021-10-01 MED ORDER — TRIAMCINOLONE ACETONIDE 0.1 % EX OINT: 1.0000 "application " | TOPICAL_OINTMENT | Freq: Two times a day (BID) | CUTANEOUS | 0 refills | Status: DC

## 2021-10-01 NOTE — Progress Notes (Signed)
WELL-WOMAN FP [facility] EXAMINATION Patient name: Chelsea Cortez MRN [id number]  Date of birth: 06-18-1981 Chief Complaint:   Gynecologic Exam (Family Planning; rash on back & on hair line X 2 months; wants blood work; swelling in feet; hard to swallow at times)  History of Present Illness:   Chelsea Cortez is a 41 y.o. G33P2002 Caucasian female being seen today for a routine FP [facility] well-woman exam. 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  Current complaints: itchy rash back/hairline x , hard to swallow-feels like food gets stuck, family h/o DM-wants to check herself. Some mood swings, hot flashes, vag dryness.   PCP: Gerda Diss      does desire labs No LMP recorded. Patient has had an implant. The current method of family planning is Nexplanon inserted 04/23/20 Last pap 09/13/19. Results were: NILM w/ HRHPV negative. H/O abnormal pap: no Last mammogram: 2013 d/t palpable abnormality . Results were: birads 3, 75mm probably benign mass, had f/u u/s later and mass was gone. Family h/o breast cancer: no Last colonoscopy: never. Results were: N/A. Family h/o colorectal cancer: yes [facility]  Depression screen [facility] 2/9 10/01/2021  Decreased Interest 1  Down, Depressed, Hopeless 1  PHQ - 2 Score 2  Altered sleeping 1  Tired, decreased energy 3  Change in appetite 0  Feeling bad or failure about yourself  1  Trouble concentrating 0  Moving slowly or fidgety/restless 0  Suicidal thoughts 0  PHQ-9 Score 7     GAD 7 : Generalized Anxiety Score 10/01/2021  Nervous, Anxious, on Edge 3  Control/stop worrying 3  Worry too much - different things 3  Trouble relaxing 3  Restless 2  Easily annoyed or irritable 2  Afraid - awful might happen 0  Total GAD 7 Score 16     Review of Systems:   Pertinent items are noted in HPI Denies any headaches, blurred vision, fatigue, shortness of breath, chest pain, abdominal pain, abnormal vaginal discharge/itching/odor/irritation, problems with  periods, bowel movements, urination, or intercourse unless otherwise stated above. Pertinent History Reviewed:  Reviewed past medical,surgical, social and family history.  Reviewed problem list, medications and allergies. Physical Assessment:   Vitals:   10/01/21 1455  BP: 121/79  Pulse: (!) 101  Weight: 205 lb (93 kg)  Height: 5' 7.5" (1.715 m)  Body mass index is 31.63 kg/m.        Physical Examination:   General appearance - well appearing, and in no distress  Mental status - alert, oriented to person, place, and time  Psych:  She has a normal mood and affect  Skin - warm and dry, normal color, flaky rash down spine and along hairline c/w eczema  Chest - effort normal, all lung fields clear to auscultation bilaterally  Heart - normal rate and regular rhythm  Neck:  midline trachea, no thyromegaly or nodules  Breasts - breasts appear normal, no suspicious masses, no skin or nipple changes or  axillary nodes  Abdomen - soft, nontender, nondistended, no masses or organomegaly  Pelvic - VULVA: normal appearing vulva with no masses, tenderness or lesions  VAGINA: normal appearing vagina with normal color and discharge, no lesions  CERVIX: normal appearing cervix without discharge or lesions, no CMT  Thin prep pap is not done   UTERUS: uterus is felt to be normal size, shape, consistency and nontender   ADNEXA: No adnexal masses or tenderness noted.  Extremities:  No swelling or varicosities noted  Chaperone: Marylu Lund  Young    No results found for this or any previous visit (from the past 24 hour(s)).  Assessment & Plan:  1) Well-Woman Exam  2) Flaky itchy rash c/w eczema> rx kenalog cream  3) Perimenopausal sx  4) Family h/o DM> A1C today per pt request  5) Dysphagia> referral to GI ordered  6) Due for screening mammogram> ordered and number given to pt to call and schedule  7) STD screen> CV swab  Labs/procedures today: CV swab, hiv, rpr, A1C  Mammogram: schedule  screening mammo as soon as possible, or sooner if problems Colonoscopy: @ 41yo, or sooner if problems  Orders Placed This Encounter  Procedures   MS DIGITAL SCREENING TOMO BILATERAL   Hemoglobin A1c   HIV Antibody (routine testing w rflx)   RPR   Ambulatory referral to Gastroenterology    Meds:  Meds ordered this encounter  Medications   triamcinolone ointment (KENALOG) 0.1 %    Sig: Apply 1 application topically 2 (two) times daily.    Dispense:  30 g    Refill:  0    Order Specific Question:   Supervising Provider    Answer:   Lazaro Arms [[id number]]    Follow-up: Return in about 1 year (around 10/01/2022) for Physical.  Cheral Marker CNM, WHNP-BC 10/01/2021 3:40 PM

## 2021-10-01 NOTE — Patient Instructions (Signed)
Call [phone] to schedule mammogram °

## 2021-10-02 ENCOUNTER — Encounter: Payer: Self-pay | Admitting: Internal Medicine

## 2021-10-02 LAB — HEMOGLOBIN A1C
Est. average glucose Bld gHb Est-mCnc: 114 mg/dL
Hgb A1c MFr Bld: 5.6 % (ref 4.8–5.6)

## 2021-10-02 LAB — HIV ANTIBODY (ROUTINE TESTING W REFLEX): HIV Screen 4th Generation wRfx: NONREACTIVE

## 2021-10-02 LAB — RPR: RPR Ser Ql: NONREACTIVE

## 2021-10-03 LAB — CERVICOVAGINAL ANCILLARY ONLY
Bacterial Vaginitis (gardnerella): NEGATIVE
Candida Glabrata: NEGATIVE
Candida Vaginitis: NEGATIVE
Chlamydia: NEGATIVE
Comment: NEGATIVE
Comment: NEGATIVE
Comment: NEGATIVE
Comment: NEGATIVE
Comment: NEGATIVE
Comment: NORMAL
Neisseria Gonorrhea: NEGATIVE
Trichomonas: NEGATIVE

## 2021-10-29 ENCOUNTER — Other Ambulatory Visit (HOSPITAL_COMMUNITY): Payer: Self-pay | Admitting: Family Medicine

## 2021-10-29 ENCOUNTER — Encounter: Payer: Self-pay | Admitting: Women's Health

## 2021-10-29 DIAGNOSIS — Z1231 Encounter for screening mammogram for malignant neoplasm of breast: Secondary | ICD-10-CM

## 2021-10-30 ENCOUNTER — Other Ambulatory Visit (HOSPITAL_COMMUNITY): Payer: Self-pay | Admitting: Family Medicine

## 2021-10-30 DIAGNOSIS — Z1231 Encounter for screening mammogram for malignant neoplasm of breast: Secondary | ICD-10-CM

## 2021-10-31 ENCOUNTER — Ambulatory Visit (HOSPITAL_COMMUNITY)
Admission: RE | Admit: 2021-10-31 | Discharge: 2021-10-31 | Disposition: A | Discharge status: Home / Self Care | Payer: 59 | Source: Ambulatory Visit | Attending: Family Medicine | Admitting: Family Medicine

## 2021-10-31 ENCOUNTER — Inpatient Hospital Stay (HOSPITAL_COMMUNITY): Admission: RE | Admit: 2021-10-31 | Payer: Medicaid Other | Source: Ambulatory Visit

## 2021-10-31 ENCOUNTER — Other Ambulatory Visit: Payer: Self-pay

## 2021-10-31 DIAGNOSIS — Z1231 Encounter for screening mammogram for malignant neoplasm of breast: Secondary | ICD-10-CM | POA: Insufficient documentation

## 2021-11-01 ENCOUNTER — Other Ambulatory Visit (HOSPITAL_COMMUNITY): Payer: Self-pay | Admitting: Family Medicine

## 2021-11-01 DIAGNOSIS — R928 Other abnormal and inconclusive findings on diagnostic imaging of breast: Secondary | ICD-10-CM

## 2021-11-04 ENCOUNTER — Encounter: Payer: Self-pay | Admitting: Women's Health

## 2021-11-04 ENCOUNTER — Other Ambulatory Visit: Payer: Self-pay

## 2021-11-04 ENCOUNTER — Encounter: Payer: Self-pay | Admitting: Nurse Practitioner

## 2021-11-04 ENCOUNTER — Ambulatory Visit: Payer: Self-pay | Admitting: Nurse Practitioner

## 2021-11-04 VITALS — BP 130/74 | HR 102 | Temp 98.1°F | Wt 212.8 lb

## 2021-11-04 DIAGNOSIS — L309 Dermatitis, unspecified: Secondary | ICD-10-CM | POA: Diagnosis not present

## 2021-11-04 DIAGNOSIS — R928 Other abnormal and inconclusive findings on diagnostic imaging of breast: Secondary | ICD-10-CM | POA: Diagnosis not present

## 2021-11-04 MED ORDER — FAMOTIDINE 20 MG PO TABS: 20.0000 mg | ORAL_TABLET | Freq: Two times a day (BID) | ORAL | 0 refills | Status: DC

## 2021-11-04 MED ORDER — CETIRIZINE HCL 10 MG PO TABS: 10.0000 mg | ORAL_TABLET | Freq: Every day | ORAL | 0 refills | Status: DC

## 2021-11-04 NOTE — Progress Notes (Signed)
? ?  Subjective:  ? ? Patient ID: Chelsea Cortez, female    DOB: [birth year], 41 y.o.   MRN: [id number] ? ?HPI ? ?41 year old female patient here to go over mammogram that was done on 10/31/21. ? ?Patient also has concerns about eczema that flared up around October or November of last year.  Patient describes the areas scaly patches that itch.  Patient was given triamcinolone and has started using Cetaphil cream which has had minimal effect.  Patient states that she has rashes on her back, base of her neck, under her arms, and in her scalp.  Patient states that she would like a referral to dermatology. ? ?Review of Systems  ?Skin:  Positive for rash.  ?All other systems reviewed and are negative. ? ?   ?Objective:  ? Physical Exam ?Constitutional:   ?   General: She is not in acute distress. ?   Appearance: Normal appearance. She is obese. She is not ill-appearing, toxic-appearing or diaphoretic.  ?Cardiovascular:  ?   Rate and Rhythm: Normal rate and regular rhythm.  ?   Pulses: Normal pulses.  ?   Heart sounds: Normal heart sounds. No murmur heard. ?Pulmonary:  ?   Effort: Pulmonary effort is normal.  ?   Breath sounds: Normal breath sounds. No wheezing.  ?Musculoskeletal:     ?   General: Normal range of motion.  ?Skin: ?   General: Skin is warm.  ?   Capillary Refill: Capillary refill takes less than 2 seconds.  ?Neurological:  ?   General: No focal deficit present.  ?   Mental Status: She is alert and oriented to person, place, and time.  ?Psychiatric:     ?   Mood and Affect: Mood normal.     ?   Behavior: Behavior normal.  ? ? ? ? ? ?   ?Assessment & Plan:  ? ?1. Dermatitis ?-Continue to use triamcinolone as needed. ?-We will add to histamine blockers to hopefully help with symptoms. ?- famotidine (PEPCID) 20 MG tablet; Take 1 tablet (20 mg total) by mouth 2 (two) times daily.  Dispense: 60 tablet; Refill: 0 ?- cetirizine (ZYRTEC ALLERGY) 10 MG tablet; Take 1 tablet (10 mg total) by mouth daily.  Dispense: 30  tablet; Refill: 0 ?-May use over-the-counter Benadryl 25 mg if no relief with Pepcid or Zyrtec. ?- Ambulatory referral to Dermatology ?-Return to clinic if rash is not better or is worse. ?-If the dermatology referral is nonconclusive for etiology of rash will consider referral to allergy specialist. ? ?2. Abnormal mammogram of both breasts ?-Discussed in detail patient's mammogram results. ?-Discussed that a diagnostic mammogram and/or ultrasound is recommended at this time.  If diagnostic mammogram and/or ultrasound is nonconclusive a needle biopsy will likely be recommended.  If the needle biopsy is positive patient may be required to meet with general surgeon if necessary.  Patient stated understanding. ?-Patient informed to follow-up with Shawna Clamp certified nurse midwife for follow-up of mammogram diagnostics. ?-Patient also encouraged to call radiologist to schedule diagnostic mammogram.  Patient stated understanding. ? ?  ?Note:  This document was prepared using Dragon voice recognition software and may include unintentional dictation errors. ? ? ?

## 2021-11-07 ENCOUNTER — Telehealth: Payer: Self-pay | Admitting: Dermatology

## 2021-11-07 NOTE — Telephone Encounter (Signed)
Patient is calling for a referral appointment from Alvis Lemmings, NP.  Patient does not want to wait until October 2023 for an appointment so would like referral sent back to [facility], NP's office. ?

## 2021-11-08 ENCOUNTER — Other Ambulatory Visit: Payer: Self-pay | Admitting: Nurse Practitioner

## 2021-11-08 DIAGNOSIS — L309 Dermatitis, unspecified: Secondary | ICD-10-CM

## 2021-11-08 NOTE — Telephone Encounter (Signed)
[facility], Trenton Gammon, NP   ? ?It may take at least a week or more for everything to calm down. But the fact that it is helping a little makes me think it may be allergies.  ? ?I have put in a referral to [facility].  ? ?Barbee Shropshire   ? ?

## 2021-11-11 NOTE — Telephone Encounter (Signed)
Notes documented and referral routed back to referring office. 

## 2021-11-19 ENCOUNTER — Other Ambulatory Visit (HOSPITAL_COMMUNITY): Payer: Self-pay | Admitting: Family Medicine

## 2021-11-19 DIAGNOSIS — R928 Other abnormal and inconclusive findings on diagnostic imaging of breast: Secondary | ICD-10-CM

## 2021-11-26 ENCOUNTER — Ambulatory Visit (HOSPITAL_COMMUNITY)
Admission: RE | Admit: 2021-11-26 | Discharge: 2021-11-26 | Disposition: A | Discharge status: Home / Self Care | Payer: 59 | Source: Ambulatory Visit | Attending: Family Medicine | Admitting: Family Medicine

## 2021-11-26 DIAGNOSIS — R928 Other abnormal and inconclusive findings on diagnostic imaging of breast: Secondary | ICD-10-CM | POA: Insufficient documentation

## 2021-12-10 ENCOUNTER — Other Ambulatory Visit: Payer: Self-pay | Admitting: Nurse Practitioner

## 2021-12-10 DIAGNOSIS — L309 Dermatitis, unspecified: Secondary | ICD-10-CM

## 2021-12-11 MED ORDER — FAMOTIDINE 20 MG PO TABS: 20.0000 mg | ORAL_TABLET | Freq: Two times a day (BID) | ORAL | 0 refills | Status: DC

## 2021-12-11 MED ORDER — CETIRIZINE HCL 10 MG PO TABS: 10.0000 mg | ORAL_TABLET | Freq: Every day | ORAL | 0 refills | Status: DC

## 2021-12-12 ENCOUNTER — Other Ambulatory Visit: Payer: Self-pay | Admitting: Nurse Practitioner

## 2021-12-12 DIAGNOSIS — L309 Dermatitis, unspecified: Secondary | ICD-10-CM

## 2021-12-20 ENCOUNTER — Ambulatory Visit: Payer: 59 | Admitting: Allergy & Immunology

## 2021-12-20 ENCOUNTER — Encounter: Payer: Self-pay | Admitting: Allergy & Immunology

## 2021-12-20 VITALS — BP 130/82 | HR 94 | Temp 98.7°F | Resp 16 | Ht 68.0 in | Wt 208.6 lb

## 2021-12-20 DIAGNOSIS — L508 Other urticaria: Secondary | ICD-10-CM

## 2021-12-20 DIAGNOSIS — L309 Dermatitis, unspecified: Secondary | ICD-10-CM | POA: Diagnosis not present

## 2021-12-20 DIAGNOSIS — J310 Chronic rhinitis: Secondary | ICD-10-CM

## 2021-12-20 NOTE — Progress Notes (Signed)
? ?NEW PATIENT ? ?Date of Service/Encounter:  12/20/21 ? ?Consult requested by: Coral Spikes, DO ? ? ?Assessment:  ? ?Chronic rhinitis - with negative testing to the entire panel ? ?Chronic urticaria  ? ?Plan/Recommendations:  ? ?1. Chronic rhinitis ?- Testing today showed: negative to the entire panel ?- Copy of test results provided.  ?- Continue with: Zyrtec (cetirizine) 12m tablet TWICE daily ?- Start taking: Singulair (montelukast) 141mdaily ?- You can use an extra dose of the antihistamine, if needed, for breakthrough symptoms.  ?- Consider nasal saline rinses 1-2 times daily to remove allergens from the nasal cavities as well as help with mucous clearance (this is especially helpful to do before the nasal sprays are given) ? ?2. Chronic urticaria ?- Your history does not have any "red flags" such as fevers, joint pains, or permanent skin changes that would be concerning for a more serious cause of hives.  ?- We did testing that was negative to the most common foods. ?- There is a the low positive predictive value of food allergy testing and hence the high possibility of false positives. ?- In contrast, food allergy testing has a high negative predictive value, therefore if testing is negative we can be relatively assured that they are indeed negative.  ?- This ruled out >95% of all food allergies.  ?- We will get some labs to rule out serious causes of hives: alpha gal panel, complete blood count, tryptase level, chronic urticaria panel, CMP, ESR, and CRP. ?- Chronic hives are often times a self limited process and will "burn themselves out" over 12-18 months, although this is not always the case.  ?- In the meantime, start suppressive dosing of antihistamines:  ? - Morning: Zyrtec (cetirizine) 10 mg + Pepcid (famotidine) 2062m - Evening: Zyrtec (cetirizine) 10 mg + Pepcid (famotidine) 49m35mSingulair (montelukast) 10mg40mYou can change this dosing at home, decreasing the dose as needed or increasing  the dosing as needed.  ?- If you are not tolerating the medications or are tired of taking them every day, we can start treatment with a monthly injectable medication called Xolair.  ? ?3. Return in about 4 weeks (around 01/17/2022).  ? ? ?This note in its entirety was forwarded to the Provider who requested this consultation. ? ?Subjective:  ? ?Chelsea Cortez 41 y.5 female presenting today for evaluation of  ?Chief Complaint  ?Patient presents with  ? Rash  ?  Rash all over her body for about a year, went to PCP and they gave her Zyrtec & Pepcid and that seemed to clear it up, but she hasn't been on it for about a week and it's starting to come back.  ? ? ?Chelsea G LewSheppard Plumbera history of the following: ?Patient Active Problem List  ? Diagnosis Date Noted  ? 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 Nexplanon insertion 01/15/2015  ? Neck pain 01/12/2014  ? Trigger point 01/12/2014  ? Chronic pain syndrome 01/12/2014  ? Fibromyalgia 01/12/2014  ? Lumbar pain 01/12/2014  ? ? ?History obtained from: chart review and patient. ? ?Chelsea Cortez,Coral Spikes    ? ?Chelsea Cortez 41 y.17 female presenting for an evaluation of rash/urticaria . She has itching that has been going on for one year. She had no changed anything at all. She denies new exposures or foods. It got better with the cetirizine and the Pepcid. It cleared up within two weeks.  Her OB said that this was eczema.  ? ?She did notice a brown spot on the back of her neck. This appeared after some of her other bumps had resolved. She has some lesions on her lower back that have reappeared. She was referred to Dermatology and the wait for 9 months. She was on TAC for a period of time. She said that this did not help at all.  ?  ?Allergic Rhinitis Symptom History: She does have a history of seasonal allergies that are worse now going into the fall. She did take some intermittent Advil sinus or something for this. She has not used nose sprays.   ? 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 The air system is not great. She started that job 6 years ago. She worked there for five years without a problem. It does not change when she is at home. Boyfriend is at home and is not affected. ? ?Otherwise, there is no history of other atopic diseases, including asthma, food allergies, drug allergies, stinging insect allergies, or contact dermatitis. There is no significant infectious history. Vaccinations are up to date.  ? ? ?Past Medical History: ?Patient Active Problem List  ? Diagnosis Date Noted  ? 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 Nexplanon insertion 01/15/2015  ? Neck pain 01/12/2014  ? Trigger point 01/12/2014  ? Chronic pain syndrome 01/12/2014  ? Fibromyalgia 01/12/2014  ? Lumbar pain 01/12/2014  ? ? ?Medication List:  ?Allergies as of 12/20/2021   ?No Known Allergies ?  ? ?  ?Medication List  ?  ? ?  ? Accurate as of December 20, 2021 11:59 PM. If you have any questions, ask your nurse or doctor.  ?  ?  ? ?  ? ?STOP taking these medications   ? ?ibuprofen 200 MG tablet ?Commonly known as: ADVIL ?Stopped by: Valentina Shaggy, MD ?  ?triamcinolone ointment 0.1 % ?Commonly known as: KENALOG ?Stopped by: Valentina Shaggy, MD ?  ? ?  ? ?TAKE these medications   ? ?cetirizine 10 MG tablet ?Commonly known as: ZyrTEC Allergy ?Take 1 tablet (10 mg total) by mouth 2 (two) times daily as needed for allergies (Can take an extra dose during flare ups.). ?What changed:  ?when to take this ?reasons to take this ?Changed by: Valentina Shaggy, MD ?  ?famotidine 20 MG tablet ?Commonly known as: PEPCID ?Take 1 tablet (20 mg total) by mouth 2 (two) times daily. ?  ?montelukast 10 MG tablet ?Commonly known as: Singulair ?Take 1 tablet (10 mg total) by mouth at bedtime. ?Started by: Valentina Shaggy, MD ?  ?Nexplanon 68 MG Impl implant ?Generic drug: etonogestrel ?1 each by Subdermal route once. ?  ? ?  ? ? ?Birth History:  non-contributory ? ?Developmental History: non-contributory ? ?Past Surgical History: ?No past surgical history on file. ? ? ?Family History: ?Family History  ?Problem Relation Age of Onset  ? Urticaria Mother   ? Asthma Mother   ? Diabetes Mother   ? Cancer Mother   ?     kidney  ? Allergic rhinitis Maternal Grandmother   ? Urticaria Maternal Grandmother   ? Asthma Maternal Grandmother   ? Diabetes Maternal Grandmother   ? Heart murmur Maternal Grandmother   ? ADD / ADHD Son   ? ADD / ADHD Son   ? Eczema Neg Hx   ? ? ? ?Social History: Shriya lives at home with her boyfriend.  She lives in a  house that is 68 years old.  There is laminate and vinyl throughout the home.  She has carpeting in the bedrooms.  She has electric heating and window units for cooling.  There are no animals inside or outside of the home.  There are dust mite covers on the bed, but not the pillows.  There is tobacco exposure in the home.  She currently works as an Barista for the past 6 years.  There is no fume, chemical, or dust exposure. ? ? ?Review of Systems  ?Constitutional: Negative.  Negative for chills, fever, malaise/fatigue and weight loss.  ?HENT:  Positive for congestion. Negative for ear discharge, ear pain and sinus pain.   ?Eyes:  Negative for pain, discharge and redness.  ?Respiratory:  Negative for cough, sputum production, shortness of breath and wheezing.   ?Cardiovascular: Negative.  Negative for chest pain and palpitations.  ?Gastrointestinal:  Negative for abdominal pain, constipation, diarrhea, heartburn, nausea and vomiting.  ?Skin:  Positive for itching and rash.  ?Neurological:  Negative for dizziness and headaches.  ?Endo/Heme/Allergies:  Positive for environmental allergies. Does not bruise/bleed easily.   ? ? ? ?Objective:  ? ?Blood pressure 130/82, pulse 94, temperature 98.7 ?F (37.1 ?C), temperature source Temporal, resp. rate 16, height 5' 8" (1.727 m), weight 208 lb 9.6 oz (94.6 kg), SpO2 98 %. ?Body mass  index is 31.72 kg/m?. ? ? ? ? ?Physical Exam ?Vitals reviewed.  ?Constitutional:   ?   Appearance: She is well-developed.  ?HENT:  ?   Head: Normocephalic and atraumatic.  ?   Right Ear: Tympanic membrane, ear canal and external

## 2021-12-20 NOTE — Patient Instructions (Addendum)
1. Chronic rhinitis ?- Testing today showed: negative to the entire panel ?- Copy of test results provided.  ?- Continue with: Zyrtec (cetirizine) 39m tablet TWICE daily ?- Start taking: Singulair (montelukast) 195mdaily ?- You can use an extra dose of the antihistamine, if needed, for breakthrough symptoms.  ?- Consider nasal saline rinses 1-2 times daily to remove allergens from the nasal cavities as well as help with mucous clearance (this is especially helpful to do before the nasal sprays are given) ? ?2. Chronic urticaria ?- Your history does not have any "red flags" such as fevers, joint pains, or permanent skin changes that would be concerning for a more serious cause of hives.  ?- We did testing that was negative to the most common foods. ?- There is a the low positive predictive value of food allergy testing and hence the high possibility of false positives. ?- In contrast, food allergy testing has a high negative predictive value, therefore if testing is negative we can be relatively assured that they are indeed negative.  ?- This ruled out >95% of all food allergies.  ?- We will get some labs to rule out serious causes of hives: alpha gal panel, complete blood count, tryptase level, chronic urticaria panel, CMP, ESR, and CRP. ?- Chronic hives are often times a self limited process and will "burn themselves out" over 12-18 months, although this is not always the case.  ?- In the meantime, start suppressive dosing of antihistamines:  ? - Morning: Zyrtec (cetirizine) 10 mg + Pepcid (famotidine) 2062m - Evening: Zyrtec (cetirizine) 10 mg + Pepcid (famotidine) 36m32mSingulair (montelukast) 10mg5mYou can change this dosing at home, decreasing the dose as needed or increasing the dosing as needed.  ?- If you are not tolerating the medications or are tired of taking them every day, we can start treatment with a monthly injectable medication called Xolair.  ? ?3. Return in about 4 weeks (around 01/17/2022).   ? ? ?Please inform us of[facility] Emergency Department visits, hospitalizations, or changes in symptoms. Call us beKoreare going to the ED for breathing or allergy symptoms since we might be able to fit you in for a sick visit. Feel free to contact us anKoreaime with any questions, problems, or concerns. ? ?It was a pleasure to meet you today! ? ?Websites that have reliable patient information: ?1. American Academy of Asthma, Allergy, and Immunology: www.aaaai.org ?2. Food Allergy Research and Education (FARE): foodallergy.org ?3. Mothers of Asthmatics: http://www.asthmacommunitynetwork.org ?4. AmeriSPX Corporationllergy, Asthma, and Immunology: www.aMonthlyElectricBill.co.uk?COVID-19 Vaccine Information can be found at: [email or web address] related to vaccine distribution or appointments, please email [email or web address] or call [phone]?We realize that you might be concerned about having an allergic reaction to the COVID19 vaccines. To help with that concern, WE ARE OFFERING THE COVID19 VACCINES IN OUR OFFICE! Ask the front desk for dates!  ? ? ? ??Like? us onKoreaacebook and Instagram for our latest updates!  ?  ? ? ?A healthy democracy works best when ALL vNew York Life Insuranceicipate! Make sure you are registered to vote! If you have moved or changed any of your contact information, you will need to get this updated before voting! ? ?In some cases, you MAY be able to register to vote online: [email or web address]? ? ? ? ? Airborne Adult Perc - 12/20/21 1438   ? ? Time Antigen Placed 0230   ? Allergen Manufacturer GreerLavella HammockLocation  Back   ? Number of Test 59   ? Panel 1 Select   ? 1. Control-Buffer 50% Glycerol Negative   ? 2. Control-Histamine 1 mg/ml 2+   ? 3. Albumin saline Negative   ? 4. Tintah Negative   ? 5. [place] Negative   ? 6. Johnson Negative   ? 7. Irwin Blue Negative   ? 8. Meadow Fescue Negative   ? 9. Perennial  Rye Negative   ? 10. Sweet Vernal Negative   ? 11. Timothy Negative   ? 12. Cocklebur Negative   ? 13. Burweed Marshelder Negative   ? 14. Ragweed, short Negative   ? 15. Ragweed, Giant Negative   ? 16. Plantain,  English Negative   ? 17. Lamb's Quarters Negative   ? 18. Sheep Sorrell Negative   ? 19. Rough Pigweed Negative   ? 20. Marsh Elder, Rough Negative   ? 21. Mugwort, Common Negative   ? 22. Ash mix Negative   ? 23. Wendee Copp mix Negative   ? 24. Beech American Negative   ? 25. Box, Elder Negative   ? 26. Cedar, red Negative   ? 27. Cottonwood, [place] Negative   ? 28. Elm mix Negative   ? 29. Hickory Negative   ? 30. Maple mix Negative   ? 31. Oak, [place] mix Negative   ? 32. Pecan Pollen Negative   ? 33. Pine mix Negative   ? 34. Sycamore Eastern Negative   ? 35. Walnut, Black Pollen Negative   ? 36. Alternaria alternata Negative   ? 61. Cladosporium Herbarum Negative   ? 38. Aspergillus mix Negative   ? 39. Penicillium mix Negative   ? 40. Bipolaris sorokiniana (Helminthosporium) Negative   ? 41. Drechslera spicifera (Curvularia) Negative   ? 42. Mucor plumbeus Negative   ? 43. Fusarium moniliforme Negative   ? 44. Aureobasidium pullulans (pullulara) Negative   ? 45. Rhizopus oryzae Negative   ? 46. Botrytis cinera Negative   ? 47. Epicoccum nigrum Negative   ? 48. Phoma betae Negative   ? 49. Candida Albicans Negative   ? 50. Trichophyton mentagrophytes Negative   ? 51. Mite, D Farinae  5,000 AU/ml Negative   ? 52. Mite, D Pteronyssinus  5,000 AU/ml Negative   ? 53. Cat Hair 10,000 BAU/ml Negative   ? 54.  Dog Epithelia Negative   ? 55. Mixed Feathers Negative   ? 56. Horse Epithelia Negative   ? 57. Cockroach, [place] Negative   ? 58. Mouse Negative   ? 59. Tobacco Leaf Negative   ? ?  ?  ? ?  ? ? Food Perc - 12/20/21 1438   ? ?  ? Test Information  ? Time Antigen Placed 0230   ? Allergen Manufacturer Lavella Hammock   ? Location Back   ? Number of allergen test 10   ? Food Select   ?  ? Food  ? 1. Peanut Negative    ? 2. Soybean food Negative   ? 3. Wheat, whole Negative   ? 4. Sesame Negative   ? 5. Milk, cow Negative   ? 6. Egg White, chicken Negative   ? 7. Casein Negative   ? 8. Shellfish mix Negative   ? 9. Fish mix Negative   ? 10. Cashew Negative   ? ?  ?  ? ?  ? ? ? ? ? ? ? ?

## 2021-12-23 MED ORDER — CETIRIZINE HCL 10 MG PO TABS: 10.0000 mg | ORAL_TABLET | Freq: Two times a day (BID) | ORAL | 5 refills | Status: AC | PRN

## 2021-12-23 MED ORDER — MONTELUKAST SODIUM 10 MG PO TABS: 10.0000 mg | ORAL_TABLET | Freq: Every evening | ORAL | 5 refills | Status: AC

## 2021-12-23 MED ORDER — FAMOTIDINE 20 MG PO TABS: 20.0000 mg | ORAL_TABLET | Freq: Two times a day (BID) | ORAL | 5 refills | Status: AC

## 2021-12-24 ENCOUNTER — Encounter: Payer: Self-pay | Admitting: Allergy & Immunology

## 2021-12-24 LAB — ALPHA-GAL PANEL
Allergen Lamb IgE: <0.1 kU/L
Beef IgE: <0.1 kU/L
IgE (Immunoglobulin E), Serum: 26 IU/mL (ref 6–495)
O215-IgE Alpha-Gal: 0.26 kU/L — AB
Pork IgE: <0.1 kU/L

## 2021-12-27 ENCOUNTER — Encounter: Payer: Self-pay | Admitting: Allergy & Immunology

## 2022-01-01 LAB — CMP14+EGFR
ALT: 24 IU/L (ref 0–32)
AST: 18 IU/L (ref 0–40)
Albumin/Globulin Ratio: 1.8 (ref 1.2–2.2)
Albumin: 4.4 g/dL (ref 3.8–4.8)
Alkaline Phosphatase: 89 IU/L (ref 44–121)
BUN/Creatinine Ratio: 12 (ref 9–23)
BUN: 11 mg/dL (ref 6–24)
Bilirubin Total: 0.4 mg/dL (ref 0.0–1.2)
CO2: 22 mmol/L (ref 20–29)
Calcium: 9.3 mg/dL (ref 8.7–10.2)
Chloride: 103 mmol/L (ref 96–106)
Creatinine, Ser: 0.89 mg/dL (ref 0.57–1.00)
Globulin, Total: 2.5 g/dL (ref 1.5–4.5)
Glucose: 81 mg/dL (ref 70–99)
Potassium: 4 mmol/L (ref 3.5–5.2)
Sodium: 139 mmol/L (ref 134–144)
Total Protein: 6.9 g/dL (ref 6.0–8.5)
eGFR: 84 mL/min/{1.73_m2} (ref >59)

## 2022-01-01 LAB — ALLERGENS W/COMP RFLX AREA 2
Alternaria Alternata IgE: <0.1 kU/L
Aspergillus Fumigatus IgE: <0.1 kU/L
Bermuda Grass IgE: <0.1 kU/L
Cedar, Mountain IgE: <0.1 kU/L
Cladosporium Herbarum IgE: <0.1 kU/L
Cockroach, German IgE: <0.1 kU/L
Common Silver Birch IgE: <0.1 kU/L
Cottonwood IgE: <0.1 kU/L
D Farinae IgE: <0.1 kU/L
D Pteronyssinus IgE: <0.1 kU/L
E001-IgE Cat Dander: <0.1 kU/L
E005-IgE Dog Dander: <0.1 kU/L
Elm, American IgE: <0.1 kU/L
IgE (Immunoglobulin E), Serum: 27 IU/mL (ref 6–495)
Johnson Grass IgE: <0.1 kU/L
Maple/Box Elder IgE: <0.1 kU/L
Mouse Urine IgE: <0.1 kU/L
Oak, White IgE: <0.1 kU/L
Pecan, Hickory IgE: <0.1 kU/L
Penicillium Chrysogen IgE: <0.1 kU/L
Pigweed, Rough IgE: <0.1 kU/L
Ragweed, Short IgE: <0.1 kU/L
Sheep Sorrel IgE Qn: <0.1 kU/L
Timothy Grass IgE: <0.1 kU/L
White Mulberry IgE: <0.1 kU/L

## 2022-01-01 LAB — CBC WITH DIFFERENTIAL
Basophils Absolute: 0.1 10*3/uL (ref 0.0–0.2)
Basos: 1 %
EOS (ABSOLUTE): 0.1 10*3/uL (ref 0.0–0.4)
Eos: 2 %
Hematocrit: 48.9 % — ABNORMAL HIGH (ref 34.0–46.6)
Hemoglobin: 16.4 g/dL — ABNORMAL HIGH (ref 11.1–15.9)
Immature Grans (Abs): 0 10*3/uL (ref 0.0–0.1)
Immature Granulocytes: 0 %
Lymphocytes Absolute: 3.5 10*3/uL — ABNORMAL HIGH (ref 0.7–3.1)
Lymphs: 36 %
MCH: 31.6 pg (ref 26.6–33.0)
MCHC: 33.5 g/dL (ref 31.5–35.7)
MCV: 94 fL (ref 79–97)
Monocytes Absolute: 0.7 10*3/uL (ref 0.1–0.9)
Monocytes: 7 %
Neutrophils Absolute: 5.2 10*3/uL (ref 1.4–7.0)
Neutrophils: 54 %
RBC: 5.19 x10E6/uL (ref 3.77–5.28)
RDW: 13 % (ref 11.7–15.4)
WBC: 9.6 10*3/uL (ref 3.4–10.8)

## 2022-01-01 LAB — CHRONIC URTICARIA: cu index: 2.8 (ref <10)

## 2022-01-01 LAB — ANTINUCLEAR ANTIBODIES, IFA: ANA Titer 1: NEGATIVE

## 2022-01-01 LAB — C-REACTIVE PROTEIN: CRP: 5 mg/L (ref 0–10)

## 2022-01-01 LAB — THYROID ANTIBODIES
Thyroglobulin Antibody: <1 IU/mL (ref 0.0–0.9)
Thyroperoxidase Ab SerPl-aCnc: 14 IU/mL (ref 0–34)

## 2022-01-01 LAB — TRYPTASE: Tryptase: 6.8 ug/L (ref 2.2–13.2)

## 2022-01-01 LAB — SEDIMENTATION RATE: Sed Rate: 9 mm/hr (ref 0–32)

## 2022-01-14 NOTE — Progress Notes (Unsigned)
Referring Provider: Cheral Marker, CNM Primary Care Physician:  Tommie Sams, DO Primary Gastroenterologist:  Dr. Marletta Lor  No chief complaint on file.   HPI:   Chelsea Cortez is a 41 y.o. female presenting today at the request of Cheral Marker, CNM for dysphagia.   Past Medical History:  Diagnosis Date   Angio-edema    Anxiety    Chronic left shoulder pain    Chronic neck and back pain    Eczema    Farsightedness    Fibromyalgia    Headache    Nexplanon in place 01/15/2015   Got nexplanon inserted 01/02/14 in left arm   Pain management 08/25/2013   Urticaria     No past surgical history on file.  Current Outpatient Medications  Medication Sig Dispense Refill   cetirizine (ZYRTEC ALLERGY) 10 MG tablet Take 1 tablet (10 mg total) by mouth 2 (two) times daily as needed for allergies (Can take an extra dose during flare ups.). 60 tablet 5   etonogestrel (NEXPLANON) 68 MG IMPL implant 1 each by Subdermal route once.     famotidine (PEPCID) 20 MG tablet Take 1 tablet (20 mg total) by mouth 2 (two) times daily. 60 tablet 5   montelukast (SINGULAIR) 10 MG tablet Take 1 tablet (10 mg total) by mouth at bedtime. 30 tablet 5   No current facility-administered medications for this visit.    Allergies as of 01/16/2022   (No Known Allergies)    Family History  Problem Relation Age of Onset   Urticaria Mother    Asthma Mother    Diabetes Mother    Cancer Mother        kidney   Allergic rhinitis Maternal Grandmother    Urticaria Maternal Grandmother    Asthma Maternal Grandmother    Diabetes Maternal Grandmother    Heart murmur Maternal Grandmother    ADD / ADHD Son    ADD / ADHD Son    Eczema Neg Hx     Social History   Socioeconomic History   Marital status: Single    Spouse name: Not on file   Number of children: Not on file   Years of education: Not on file   Highest education level: Not on file  Occupational History   Not on file  Tobacco Use    Smoking status: Every Day    Packs/day: 1.00    Years: 15.00    Pack years: 15.00    Types: Cigarettes   Smokeless tobacco: Never  Vaping Use   Vaping Use: Never used  Substance and Sexual Activity   Alcohol use: No   Drug use: No   Sexual activity: Yes    Birth control/protection: Implant  Other Topics Concern   Not on file  Social History Narrative   Not on file   Social Determinants of Health   Financial Resource Strain: Medium Risk   Difficulty of Paying Living Expenses: Somewhat hard  Food Insecurity: No Food Insecurity   Worried About Programme researcher, broadcasting/film/video in the Last Year: Never true   Ran Out of Food in the Last Year: Never true  Transportation Needs: No Transportation Needs   Lack of Transportation (Medical): No   Lack of Transportation (Non-Medical): No  Physical Activity: Insufficiently Active   Days of Exercise per Week: 2 days   Minutes of Exercise per Session: 40 min  Stress: Stress Concern Present   Feeling of Stress : Rather much  Social Connections:  Moderately Isolated   Frequency of Communication with Friends and Family: Twice a week   Frequency of Social Gatherings with Friends and Family: Once a week   Attends Religious Services: 1 to 4 times per year   Active Member of [employer] or Organizations: No   Attends Engineer, structural: Never   Marital Status: Never married  Catering manager Violence: Not At Risk   Fear of Current or Ex-Partner: No   Emotionally Abused: No   Physically Abused: No   Sexually Abused: No    Review of Systems: Gen: Denies any fever, chills, fatigue, weight loss, lack of appetite.  CV: Denies chest pain, heart palpitations, peripheral edema, syncope.  Resp: Denies shortness of breath at rest or with exertion. Denies wheezing or cough.  GI: Denies dysphagia or odynophagia. Denies jaundice, hematemesis, fecal incontinence. GU : Denies urinary burning, urinary frequency, urinary hesitancy MS: Denies joint pain, muscle  weakness, cramps, or limitation of movement.  Derm: Denies rash, itching, dry skin Psych: Denies depression, anxiety, memory loss, and confusion Heme: Denies bruising, bleeding, and enlarged lymph nodes.  Physical Exam: There were no vitals taken for this visit. General:   Alert and oriented. Pleasant and cooperative. Well-nourished and well-developed.  Head:  Normocephalic and atraumatic. Eyes:  Without icterus, sclera clear and conjunctiva pink.  Ears:  Normal auditory acuity. Lungs:  Clear to auscultation bilaterally. No wheezes, rales, or rhonchi. No distress.  Heart:  S1, S2 present without murmurs appreciated.  Abdomen:  +BS, soft, non-tender and non-distended. No HSM noted. No guarding or rebound. No masses appreciated.  Rectal:  Deferred  Msk:  Symmetrical without gross deformities. Normal posture. Extremities:  Without edema. Neurologic:  Alert and  oriented x4;  grossly normal neurologically. Skin:  Intact without significant lesions or rashes. Psych:  Alert and cooperative. Normal mood and affect.    Assessment:     Plan:  ***   Ermalinda Memos, PA-C [facility] 01/16/2022

## 2022-01-16 ENCOUNTER — Encounter: Payer: Self-pay | Admitting: Gastroenterology

## 2022-01-16 ENCOUNTER — Telehealth: Payer: Self-pay | Admitting: Gastroenterology

## 2022-01-16 ENCOUNTER — Ambulatory Visit: Payer: 59 | Admitting: Gastroenterology

## 2022-01-16 VITALS — BP 128/74 | HR 90 | Temp 97.7°F | Ht 67.0 in | Wt 207.0 lb

## 2022-01-16 DIAGNOSIS — R1310 Dysphagia, unspecified: Secondary | ICD-10-CM | POA: Diagnosis not present

## 2022-01-16 DIAGNOSIS — Z1211 Encounter for screening for malignant neoplasm of colon: Secondary | ICD-10-CM

## 2022-01-16 DIAGNOSIS — R12 Heartburn: Secondary | ICD-10-CM

## 2022-01-16 NOTE — Telephone Encounter (Signed)
Stacey: Please place patient on recall for screening colonoscopy in September 2027.

## 2022-01-16 NOTE — Patient Instructions (Signed)
Continue taking famotidine 20 mg twice daily.  To help prevent heartburn symptoms: Avoid fried, fatty, greasy, spicy, citrus foods. Avoid caffeine and carbonated beverages. Avoid chocolate. Try eating 4-6 small meals a day rather than 3 large meals. Do not eat within 3 hours of laying down. Prop head of bed up on wood or bricks to create a 6 inch incline.  Monitor for recurrent swallowing problems and let me know if this occurs.  We will follow-up with you as needed.  Do not hesitate to call if you have any questions or concerns.  It was very nice meeting you today!  Ermalinda Memos, PA-C [facility]astroenter[facility]

## 2022-01-17 ENCOUNTER — Encounter: Payer: Self-pay | Admitting: Family Medicine

## 2022-01-17 ENCOUNTER — Ambulatory Visit: Payer: 59 | Admitting: Family Medicine

## 2022-01-17 VITALS — BP 124/70 | HR 88 | Temp 98.4°F | Resp 16

## 2022-01-17 DIAGNOSIS — J310 Chronic rhinitis: Secondary | ICD-10-CM

## 2022-01-17 DIAGNOSIS — Z91018 Allergy to other foods: Secondary | ICD-10-CM | POA: Insufficient documentation

## 2022-01-17 DIAGNOSIS — L508 Other urticaria: Secondary | ICD-10-CM | POA: Insufficient documentation

## 2022-01-17 NOTE — Progress Notes (Signed)
[address] [place] [address] Dept: [phone]  FOLLOW UP NOTE  Patient ID: Chelsea Cortez, female    DOB: 02/23/1981  Age: 41 y.o. MRN: [id number] Date of Office Visit: 01/17/2022  Assessment  Chief Complaint: alpha-gal (Stopped eating red meat and stopped taking medication for 2 weeks, started back eating red meat and taking medication about 4 or 5 days ago without any reactions.)  HPI Chelsea Cortez is a 41 year old female who presents the clinic for follow-up visit.  She was last seen in this clinic on 12/20/2021 by Dr. Dellis Anes for evaluation of chronic rhinitis urticaria.  At that time her alpha-gal level was 0.26 and her environmental allergy skin testing was negative to the entire panel. At today's visit, she reports that she has not experienced any further episodes of urticaria. She reports that she had been avoiding mammalian meat and also stopped taking cetirizine and famotidine for a 2 week stretch and her papular urticaria resolved. She reports that during that time, she did eat beef 2 times toward the end of the 2 weeks without rash. She reports that she began taking eating mammalian meat and taking cetirizine with famotidine twice a day without rash. Allergic rhinitis is reported as moderately well controlled with symptoms including clear rhinorrhea, nasal congestion, sneezing, and post nasal drainage with occasional throat clearing. She continues montelukast with improvement in her symptoms of allergic rhinitis and is not currently using a nasal steroid spray or saline nasal rinse. She does report that she has experienced some dysphagia, food lodging, and a globus sensation recently that lasted for about 1 month and has now resolved. She continues to follow with [facility]. Her current medications are listed in the chart.    Drug Allergies:  No Known Allergies  Physical Exam: BP 124/70 (BP Location: Left Arm, Patient Position: Sitting, Cuff  Size: Normal)   Pulse 88   Temp 98.4 F (36.9 C) (Temporal)   Resp 16   SpO2 99%    Physical Exam Vitals reviewed.  Constitutional:      Appearance: Normal appearance.  HENT:     Head: Normocephalic and atraumatic.     Right Ear: Tympanic membrane normal.     Left Ear: Tympanic membrane normal.     Nose:     Comments: Bilateral nares edematous and pale with clear nasal drainage noted. Pharynx slightly erythematous with no exudate. Ears normal. Eyes normal. Eyes:     Conjunctiva/sclera: Conjunctivae normal.  Cardiovascular:     Rate and Rhythm: Normal rate and regular rhythm.     Heart sounds: No murmur heard. Pulmonary:     Effort: Pulmonary effort is normal.     Breath sounds: Normal breath sounds.     Comments: Lungs clear to auscultation Musculoskeletal:        General: Normal range of motion.     Cervical back: Normal range of motion and neck supple.  Skin:    General: Skin is warm and dry.  Neurological:     Mental Status: She is alert and oriented to person, place, and time.  Psychiatric:        Mood and Affect: Mood normal.        Behavior: Behavior normal.        Thought Content: Thought content normal.        Judgment: Judgment normal.    Assessment and Plan: 1. Chronic rhinitis   2. 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 Use this before any medicated nasal sprays for best result  Hives (urticaria) Use the least amount of medications while using the lease medications Cetirizine (Zyrtec) 10mg  twice a day and famotidine (Pepcid) 20 mg twice a day. If no symptoms for 7-14 days then decrease to. Cetirizine (Zyrtec) 10mg  twice a day and  famotidine (Pepcid) 20 mg once a day.  If no symptoms for 7-14 days then decrease to. Cetirizine (Zyrtec) 10mg  twice a day.  If no symptoms for 7-14 days then decrease to. Cetirizine (Zyrtec) 10mg  once a day.  May use Benadryl (diphenhydramine) as needed for breakthrough hives       If symptoms return, then step up dosage Keep a detailed symptom journal including foods eaten, contact with allergens, medications taken, weather changes.    Alpha gal allergy Continue to include mammalian meats in your diet. If you begin to experience hives, then stop eating mammalian meats and begin the antihistamine ladder as listed above  Call the clinic if this treatment plan is not working well for you.  Follow up in 6 months or sooner if needed.  Return in about 6 months (around 07/20/2022), or if symptoms worsen or fail to improve.    Thank you for the opportunity to care for this patient.  Please do not hesitate to contact me with questions.  , FNP [facility]

## 2022-01-17 NOTE — Patient Instructions (Addendum)
Chronic rhinitis Continue montelukast 10 mg once a day to prevent allergy symptoms Continue cetirizine once or twice a day as needed for runny nose or itch Begin Flonase 2 sprays in each nostril once a day as needed for a stuffy nose Consider saline nasal rinses as needed for nasal symptoms. Use this before any medicated nasal sprays for best result  Hives (urticaria) Use the least amount of medications while using the lease medications Cetirizine (Zyrtec) 10mg  twice a day and famotidine (Pepcid) 20 mg twice a day. If no symptoms for 7-14 days then decrease to. Cetirizine (Zyrtec) 10mg  twice a day and famotidine (Pepcid) 20 mg once a day.  If no symptoms for 7-14 days then decrease to. Cetirizine (Zyrtec) 10mg  twice a day.  If no symptoms for 7-14 days then decrease to. Cetirizine (Zyrtec) 10mg  once a day.  May use Benadryl (diphenhydramine) as needed for breakthrough hives       If symptoms return, then step up dosage Keep a detailed symptom journal including foods eaten, contact with allergens, medications taken, weather changes.    Alpha gal allergy Continue to include mammalian meats in your diet. If you begin to experience hives, then stop eating mammalian meats and begin the antihistamine ladder as listed above  Call the clinic if this treatment plan is not working well for you.  Follow up in 6 months or sooner if needed.

## 2022-02-05 ENCOUNTER — Telehealth: Payer: Self-pay | Admitting: Family Medicine

## 2022-02-05 NOTE — Telephone Encounter (Signed)
Patient is in need of flonase to be sent into pharmacy.   The drug store - [place], [place] [address]

## 2022-02-10 MED ORDER — FLUTICASONE PROPIONATE 50 MCG/ACT NA SUSP: 1.0000 | Freq: Two times a day (BID) | NASAL | 5 refills | Status: AC

## 2022-02-10 NOTE — Telephone Encounter (Signed)
Called and left a message for patient to inform her that the Flonase has been sent to the pharmacy. Informed patient to call us with any issues or concerns.

## 2022-03-05 ENCOUNTER — Ambulatory Visit (INDEPENDENT_AMBULATORY_CARE_PROVIDER_SITE_OTHER): Payer: 59 | Admitting: Nurse Practitioner

## 2022-03-05 ENCOUNTER — Encounter: Payer: Self-pay | Admitting: Nurse Practitioner

## 2022-03-05 VITALS — BP 118/78 | HR 80 | Temp 98.0°F | Ht 67.0 in | Wt 210.0 lb

## 2022-03-05 DIAGNOSIS — F17200 Nicotine dependence, unspecified, uncomplicated: Secondary | ICD-10-CM | POA: Diagnosis not present

## 2022-03-05 DIAGNOSIS — F419 Anxiety disorder, unspecified: Secondary | ICD-10-CM

## 2022-03-05 MED ORDER — BUSPIRONE HCL 10 MG PO TABS: 10.0000 mg | ORAL_TABLET | Freq: Two times a day (BID) | ORAL | 0 refills | Status: DC

## 2022-03-05 MED ORDER — NICOTINE POLACRILEX 2 MG MT GUM: 2.0000 mg | CHEWING_GUM | OROMUCOSAL | 0 refills | Status: DC | PRN

## 2022-03-05 MED ORDER — NICOTINE 21 MG/24HR TD PT24: 21.0000 mg | MEDICATED_PATCH | Freq: Every day | TRANSDERMAL | 0 refills | Status: DC

## 2022-03-05 NOTE — Progress Notes (Signed)
   Subjective:    Patient ID: Chelsea Cortez, female    DOB: 11-11-1980, 41 y.o.   MRN: [id number]  HPI  Patient here for smoking cessation assistance. She previously smoked 15 cigarettes a day. Over the past 5 days she has weaned her self down to 6 cigarettes a day. She is currently using patches x1 week now. Patient states that her smoking triggers include stress and anxiety. Patient states that she feel that the patches work well, however, they do not last long enough and she feels her self becoming anxious in the evenings.   Patient has family history of suicide and does not want anything that will cause altered mentation.   Patient currently chews on gum and candy to help curb smoking cravings.  Patient has no other concerns today.    Review of Systems  All other systems reviewed and are negative.      Objective:   Physical Exam Vitals reviewed.  Constitutional:      General: She is not in acute distress.    Appearance: Normal appearance. She is normal weight. She is not ill-appearing, toxic-appearing or diaphoretic.  HENT:     Head: Normocephalic and atraumatic.  Cardiovascular:     Rate and Rhythm: Normal rate and regular rhythm.     Pulses: Normal pulses.     Heart sounds: Normal heart sounds. No murmur heard. Pulmonary:     Effort: Pulmonary effort is normal. No respiratory distress.     Breath sounds: Normal breath sounds. No wheezing.  Musculoskeletal:     Comments: Grossly intact  Skin:    General: Skin is warm.     Capillary Refill: Capillary refill takes less than 2 seconds.  Neurological:     Mental Status: She is alert.     Comments: Grossly intact  Psychiatric:        Mood and Affect: Mood normal.        Behavior: Behavior normal.        Assessment & Plan:   1. Tobacco dependence -Discussed various techniques to help with successful smoking cessation.  Discussed accountability partners, chewing gum, keeping small candy, stress management. -We  will trial patient on BuSpar to help with anxiety. - busPIRone (BUSPAR) 10 MG tablet; Take 1 tablet (10 mg total) by mouth 2 (two) times daily.  Dispense: 60 tablet; Refill: 0 - nicotine (NICODERM CQ) 21 mg/24hr patch; Place 1 patch (21 mg total) onto the skin daily.  Dispense: 28 patch; Refill: 0 - nicotine polacrilex (NICORETTE) 2 MG gum; Take 1 each (2 mg total) by mouth as needed for smoking cessation.  Dispense: 100 tablet; Refill: 0 -Follow-up in 2 weeks  2. Anxiety -Start with taking 10 mg daily for 5 days.  If feeling that you need additional medication may increase dose to 10 mg twice daily. - busPIRone (BUSPAR) 10 MG tablet; Take 1 tablet (10 mg total) by mouth 2 (two) times daily.  Dispense: 60 tablet; Refill: 0 -Return to clinic in 2 weeks    Note:  This document was prepared using Dragon voice recognition software and may include unintentional dictation errors. Note - This record has been created using [employer].  Chart creation errors have been sought, but may not always  have been located. Such creation errors do not reflect on  the standard of medical care.

## 2022-03-26 ENCOUNTER — Ambulatory Visit (INDEPENDENT_AMBULATORY_CARE_PROVIDER_SITE_OTHER): Payer: 59 | Admitting: Nurse Practitioner

## 2022-03-26 ENCOUNTER — Encounter: Payer: Self-pay | Admitting: Nurse Practitioner

## 2022-03-26 VITALS — BP 120/79 | HR 99 | Temp 98.1°F | Ht 67.0 in | Wt 209.0 lb

## 2022-03-26 DIAGNOSIS — F17200 Nicotine dependence, unspecified, uncomplicated: Secondary | ICD-10-CM

## 2022-03-26 DIAGNOSIS — F419 Anxiety disorder, unspecified: Secondary | ICD-10-CM | POA: Diagnosis not present

## 2022-03-26 MED ORDER — HYDROXYZINE PAMOATE 25 MG PO CAPS: 25.0000 mg | ORAL_CAPSULE | Freq: Three times a day (TID) | ORAL | 0 refills | Status: DC | PRN

## 2022-03-26 MED ORDER — NICOTINE 21 MG/24HR TD PT24: 21.0000 mg | MEDICATED_PATCH | Freq: Every day | TRANSDERMAL | 2 refills | Status: DC

## 2022-03-26 NOTE — Progress Notes (Signed)
   Subjective:    Patient ID: Chelsea Cortez, female    DOB: 1981-01-23, 41 y.o.   MRN: [id number]  HPI  Follow up for smoking cessation.  Patient was started on NicoDerm and Nicorette gum during last visit.  Patient states that NicoDerm seems to be helping however he does notice mild skin irritation from the patch.  Patient states that she has been rotating the patch which has been helping.  Patient states that the gum does help however it is not taste pleasant.  However patient still keeps it on board for emergencies.  Patient was trialed on BuSpar during last visit for anxiety.  Patient states that she is not felt much relief from the BuSpar.  Patient states that she increased it to twice a day but still did not feel much relief.  Patient states that she is currently down to 1 pack of cigarettes every 3 days which is down from 1-1/2 packs of cigarettes per day.  Patient states that overall she is doing well with smoking cessation.  Review of Systems  All other systems reviewed and are negative.      Objective:   Physical Exam Vitals reviewed.  Constitutional:      General: She is not in acute distress.    Appearance: Normal appearance. She is normal weight. She is not ill-appearing, toxic-appearing or diaphoretic.  HENT:     Head: Normocephalic and atraumatic.  Musculoskeletal:     Comments: Grossly intact  Neurological:     Mental Status: She is alert.     Comments: Grossly intact  Psychiatric:        Mood and Affect: Mood normal.        Behavior: Behavior normal.        Assessment & Plan:   1. Tobacco dependence -Patient continues to make progress with smoking cessation. -Patient encouraged to continue to rotate the areas where she places the NicoDerm patches to prevent skin irritation - nicotine (NICODERM CQ) 21 mg/24hr patch; Place 1 patch (21 mg total) onto the skin daily.  Dispense: 28 patch; Refill: 2 -Use Nicorette gum as needed -May also use Jolly rancher's  other gums to come to help curb oral fixation -Return to clinic 3 months  2. Anxiety -Patient did not feel relief from BuSpar. -Patient to trial hydroxyzine - hydrOXYzine (VISTARIL) 25 MG capsule; Take 1 capsule (25 mg total) by mouth every 8 (eight) hours as needed.  Dispense: 30 capsule; Refill: 0 -Side effects of hydroxyzine discussed in detail -If patient would like to continue with hydroxyzine after prescription is done patient to call clinic to request another refill. -Return to clinic 3 months    Note:  This document was prepared using Dragon voice recognition software and may include unintentional dictation errors. Note - This record has been created using [employer].  Chart creation errors have been sought, but may not always  have been located. Such creation errors do not reflect on  the standard of medical care.

## 2022-03-26 NOTE — Progress Notes (Signed)
Follow up, patient states doesn't think the buspar is working.

## 2022-06-11 IMAGING — US US BREAST*R* LIMITED INC AXILLA
1 series · 13 of 21 positions shown · non-contrast
Comparison: Previous exam(s).

CLINICAL DATA: 40-year-old female recalled from mammogram dated
10/31/2021 for possible bilateral masses/asymmetries.



[Series 1: us breast*right* limited inc axilla · 0.06mm/px · 13 of 21 slices shown]
[im 1/21]
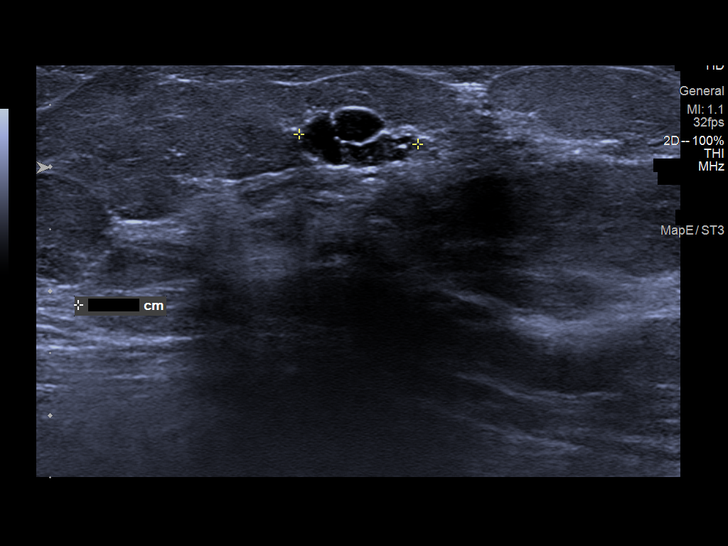
[im 3/21]
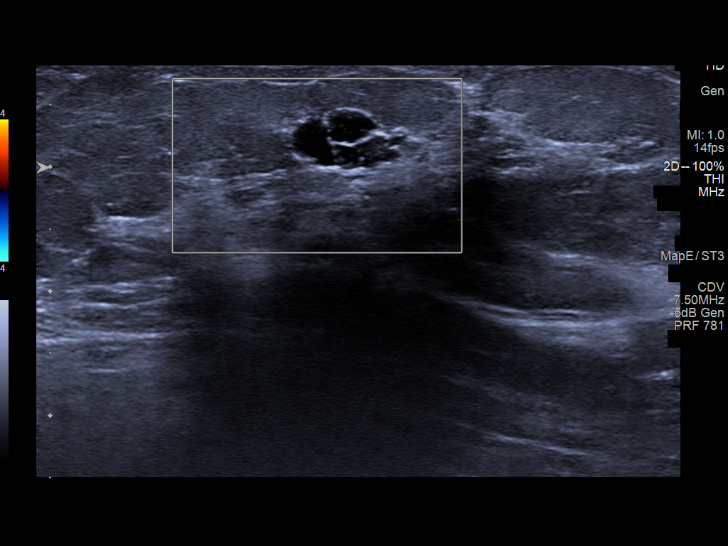
[im 5/21]
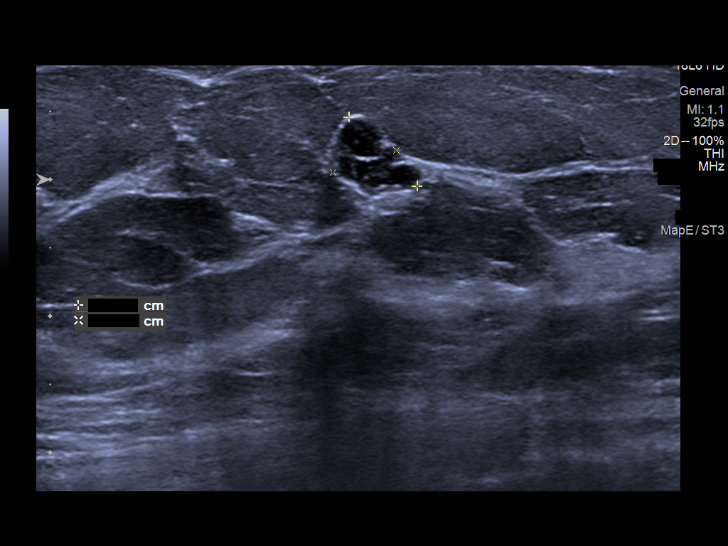
[im 6/21]
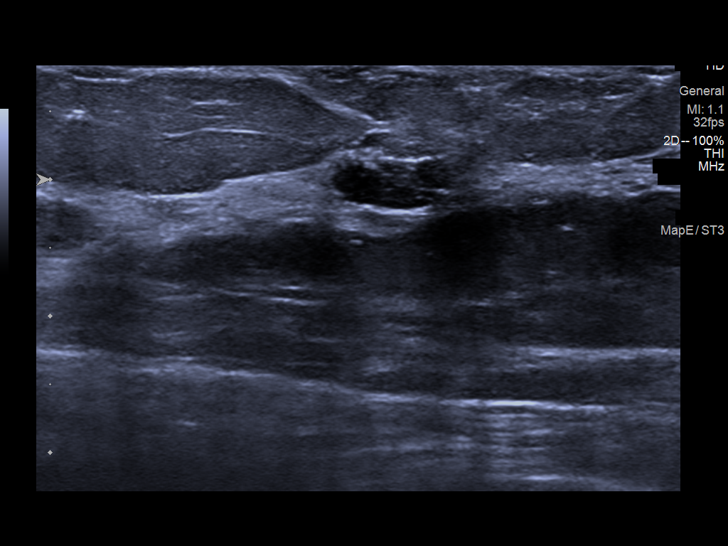
[im 8/21]
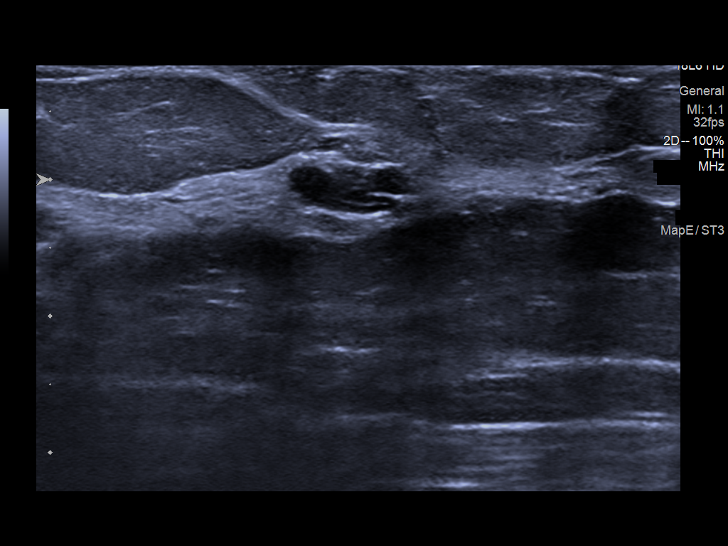
[im 9/21]
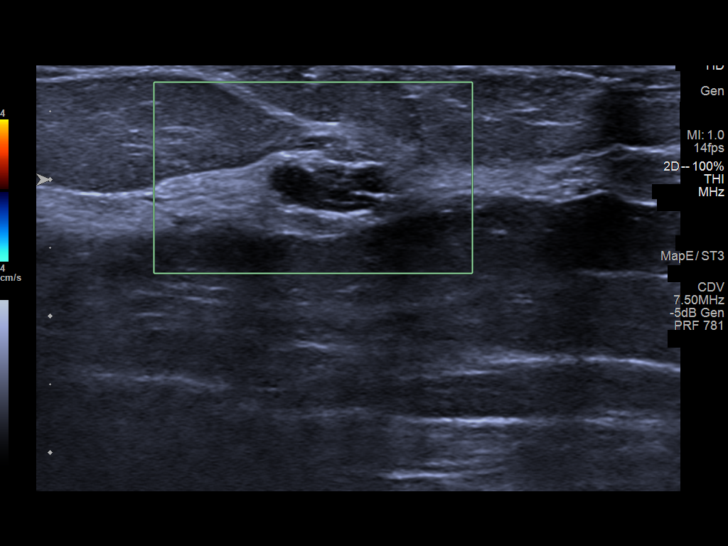
[im 11/21]
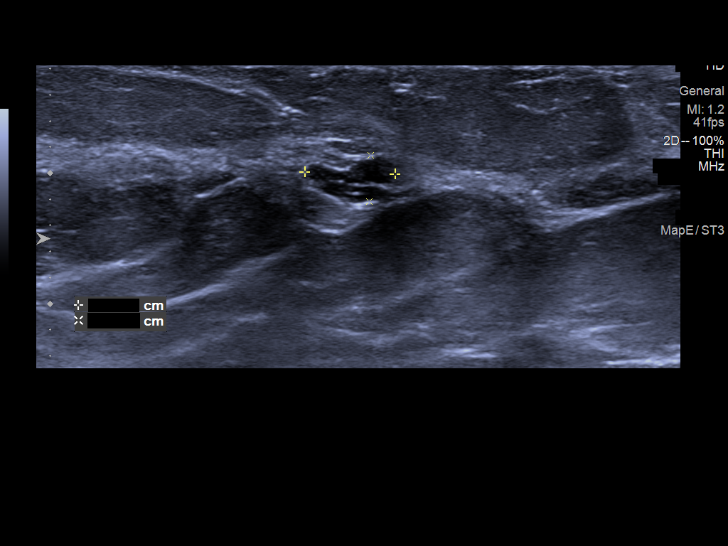
[im 13/21]
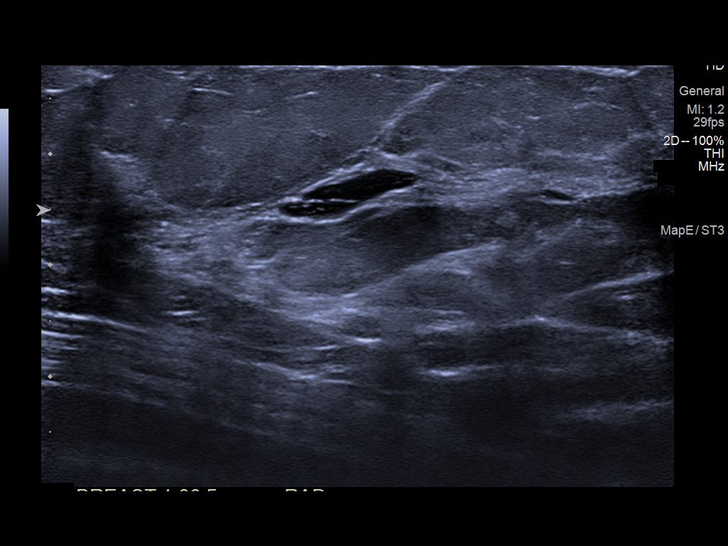
[im 14/21]
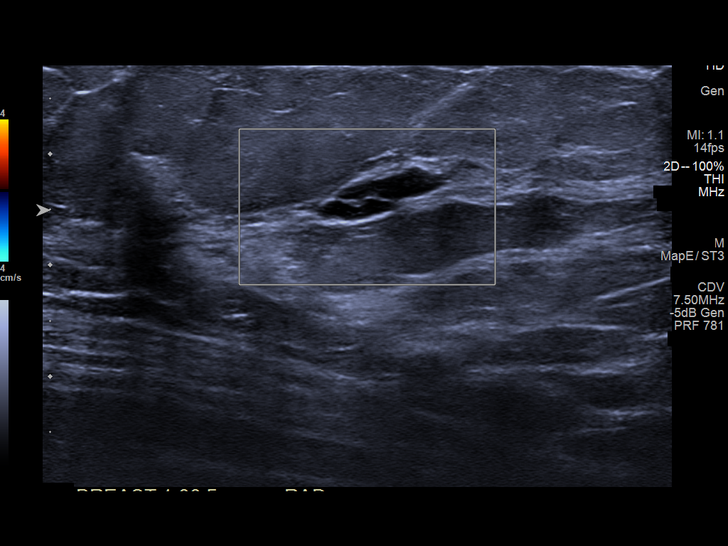
[im 16/21]
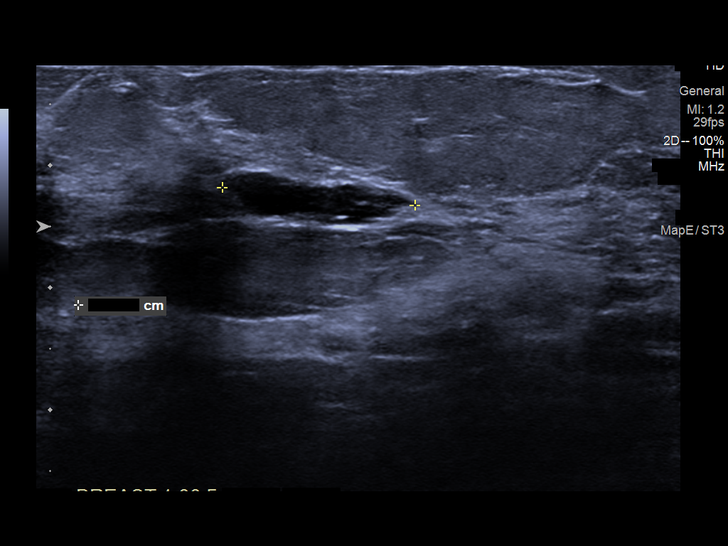
[im 17/21]
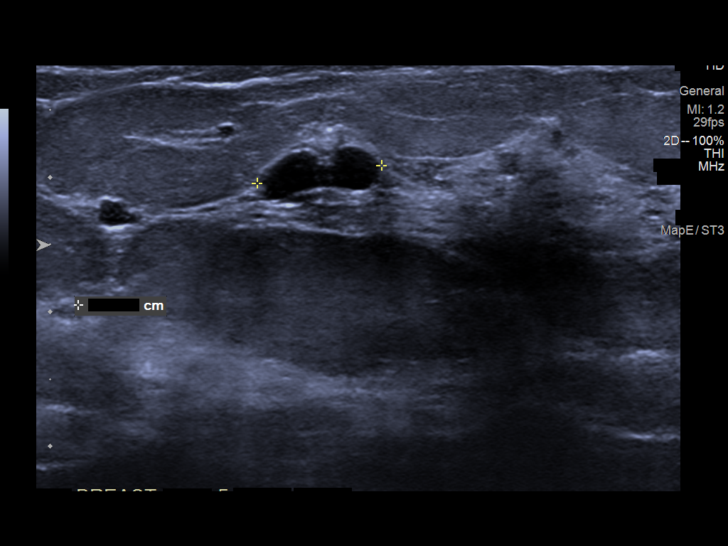
[im 19/21]
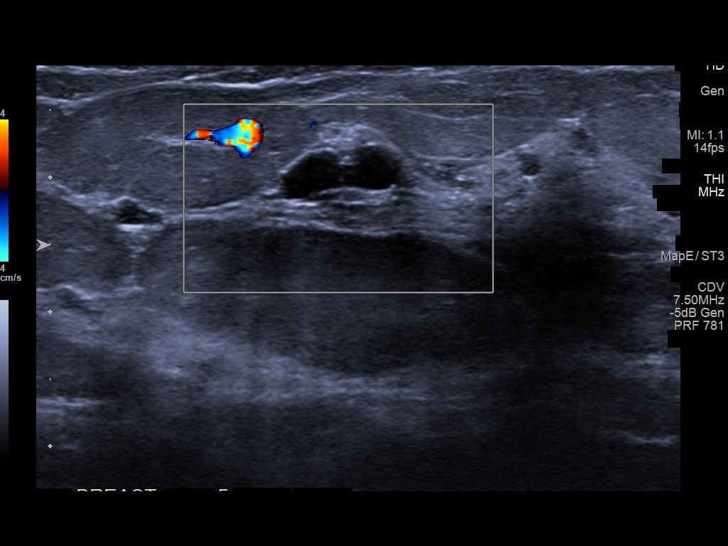
[im 21/21]
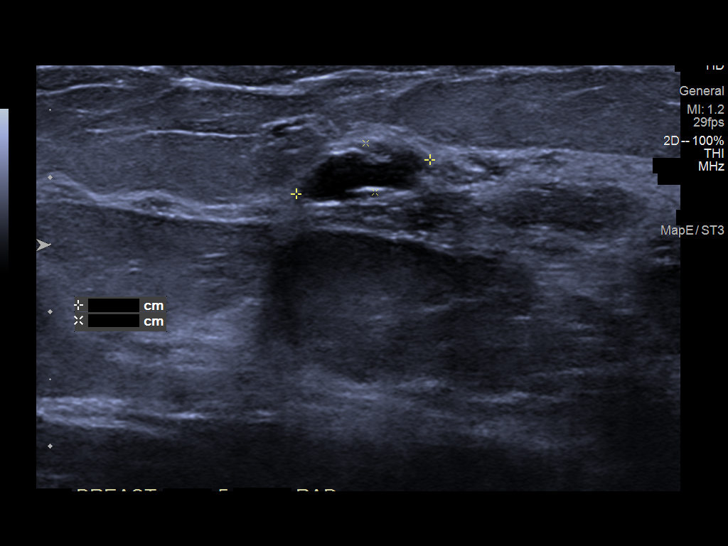

[13 of 21 positions shown; findings below may reference images not displayed]

ACR Breast Density Category c: The breast tissue is heterogeneously
dense, which may obscure small masses.
FINDINGS: There is a persistent oval, circumscribed lobulated mass in the
slightly lateral right breast at mid depth. Possible mass in the
superior central left breast at mid depth partially effaces on
additional views. Further evaluation with ultrasound was performed.

Targeted ultrasound is performed, showing benign, circumscribed
cystic clusters throughout the bilateral breasts. The largest of
these on the right are at the 9 o'clock position 5 cm from the
nipple and 6 o'clock position 2 cm from the nipple. They measure 10
x 7 x 5 mm and 9 x 7 x 4 mm respectively. The largest on the left
side are located at the 1 o'clock position 5 cm from the nipple and
11 o'clock position 5 cm from the nipple. They measure 13 x 3 x 16
mm and 9 x 10 x 4 mm respectively. These correlate well with the
mammographic findings.
IMPRESSION: Benign bilateral fibrocystic changes corresponding with the
screening mammographic findings. No further follow-up required.

RECOMMENDATION:
Screening mammogram in one year.(Code:3L-X-7CH)

I have discussed the findings and recommendations with the patient.
If applicable, a reminder letter will be sent to the patient
regarding the next appointment.

BI-RADS CATEGORY  2: Benign.

## 2022-06-11 IMAGING — MG DIGITAL DIAGNOSTIC BILAT W/ TOMO W/ CAD
8 series · 8 of 24 positions shown · non-contrast
Comparison: Previous exam(s).

CLINICAL DATA: 40-year-old female recalled from mammogram dated
10/31/2021 for possible bilateral masses/asymmetries.



[R MLO synth-2D]
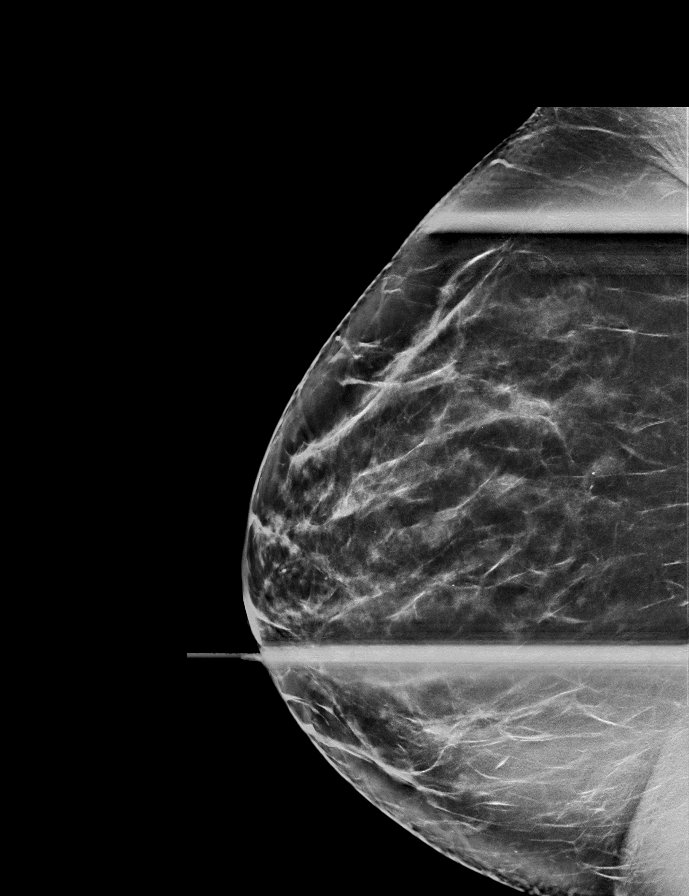

[L ML synth-2D]
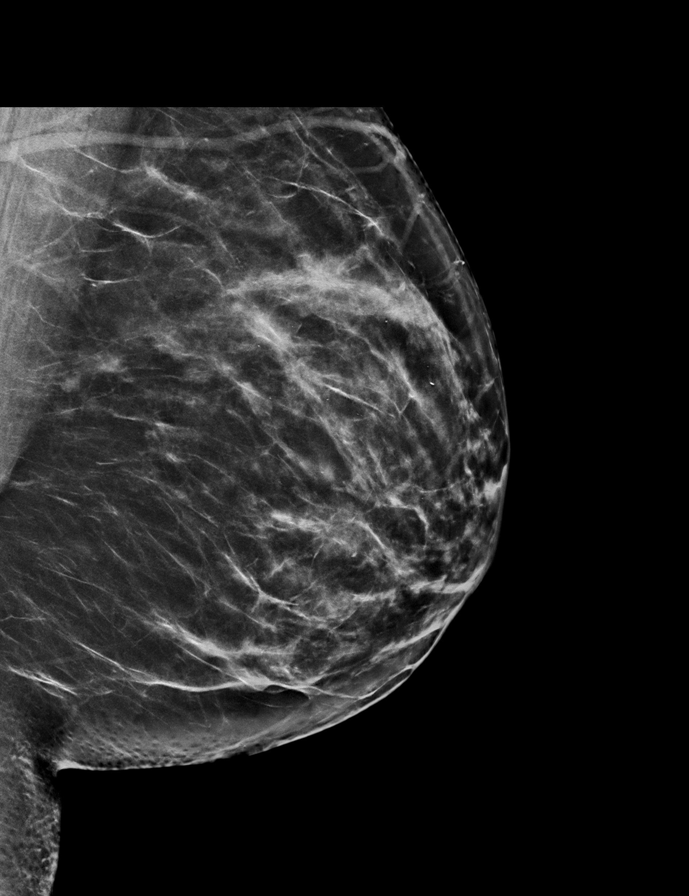

[R CC synth-2D]
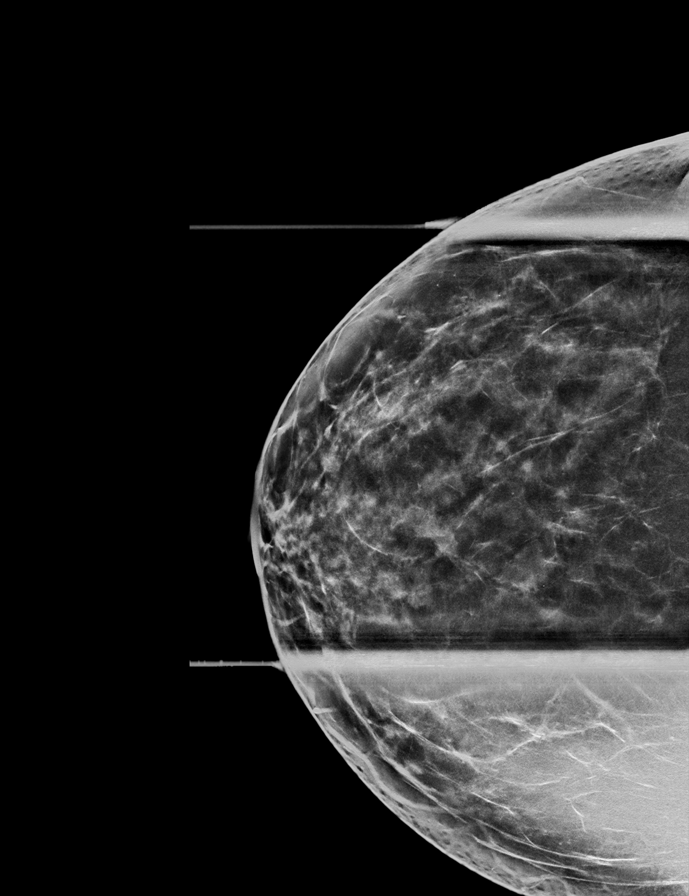

[L MLO synth-2D]
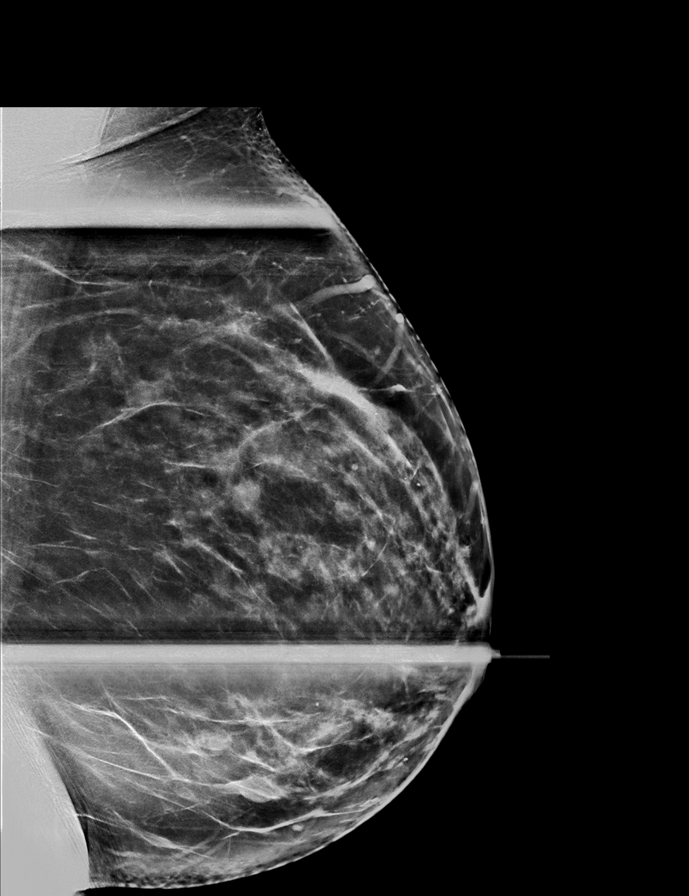

[L MLO tomo · tomo slice 39/78.0]
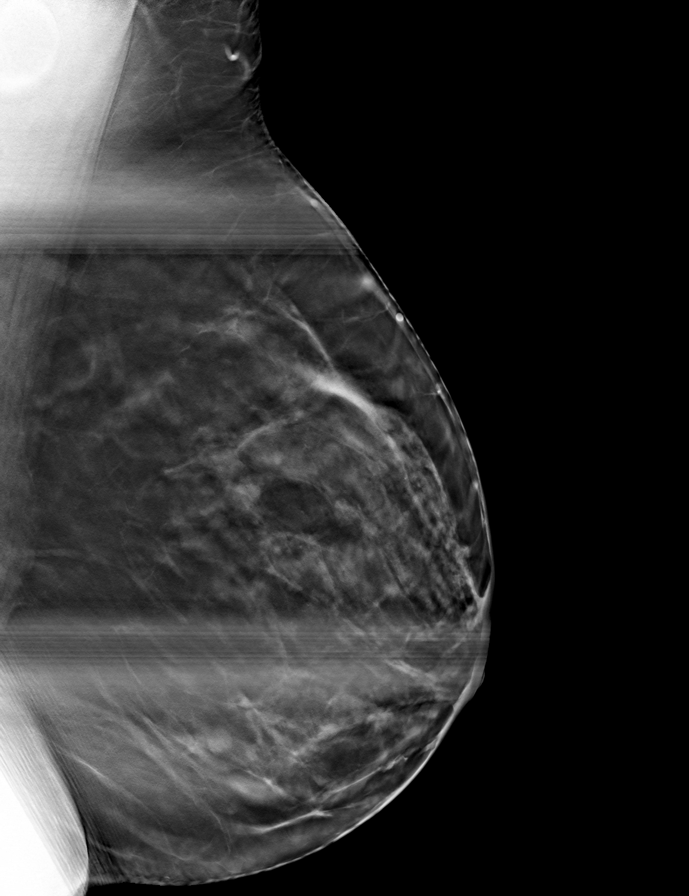

[R CC tomo · tomo slice 35/69.0]
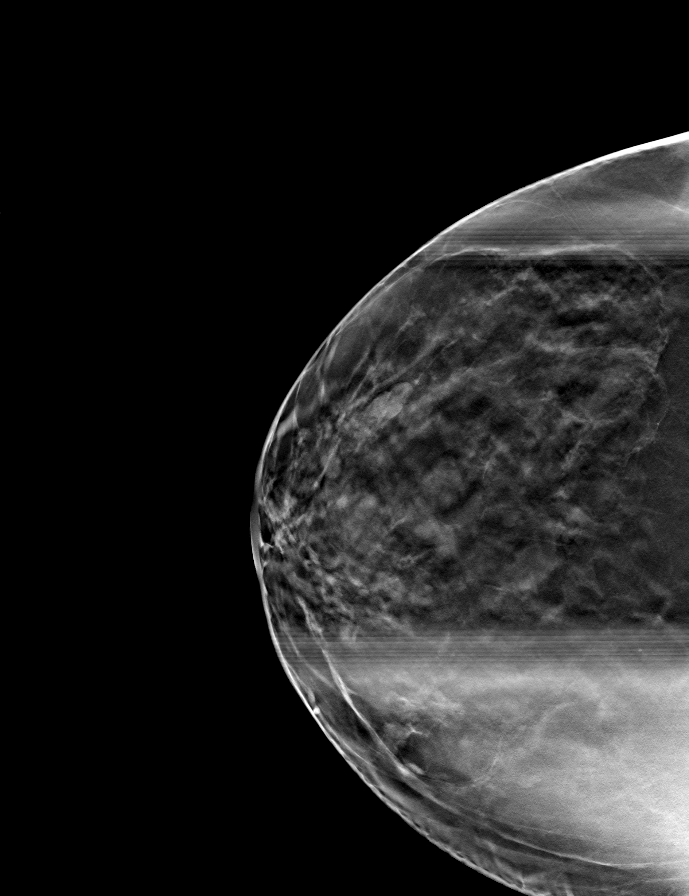

[L ML tomo · tomo slice 39/77.0]
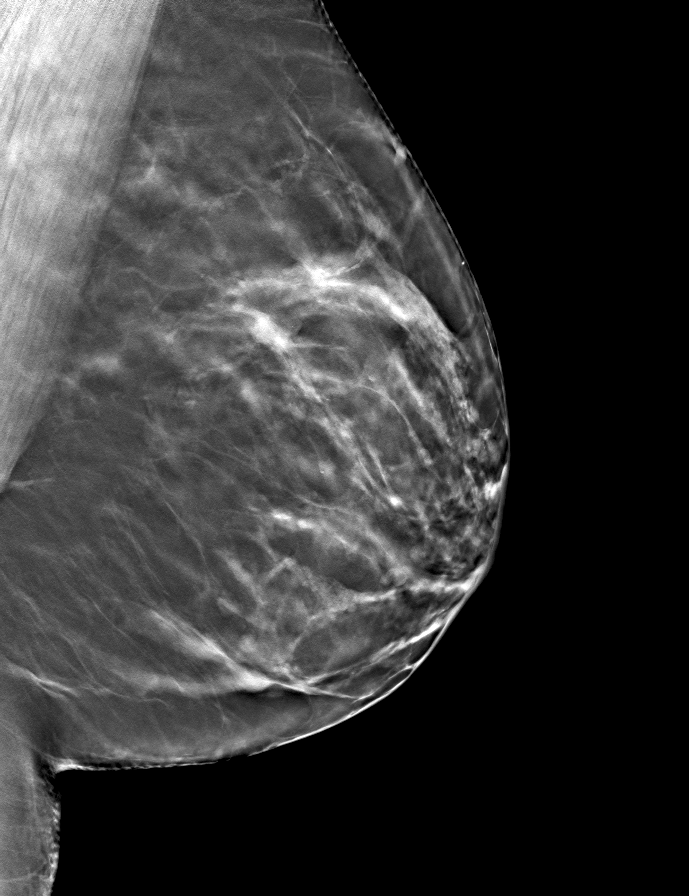

[R MLO tomo · tomo slice 37/72.0]
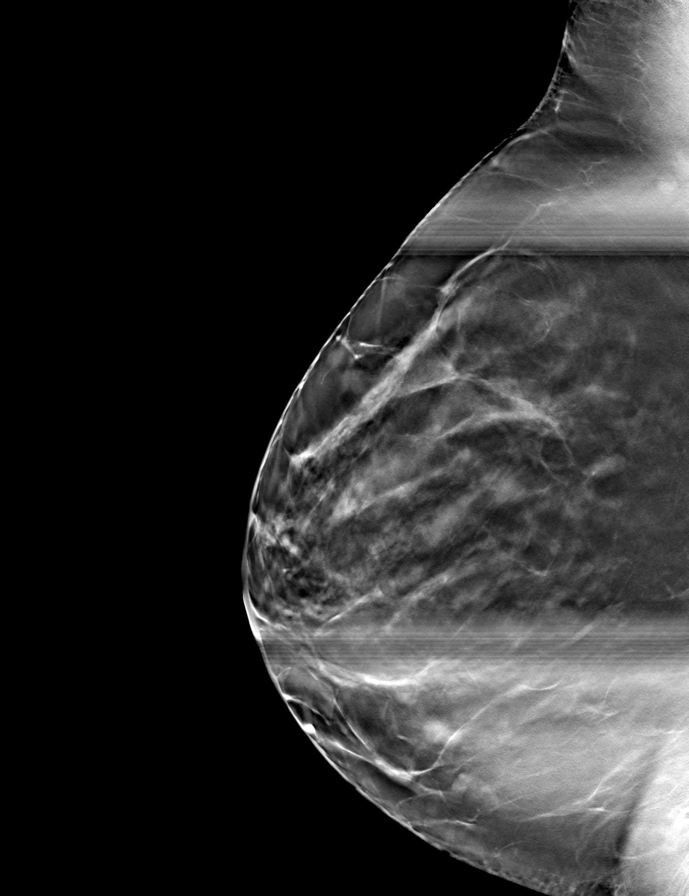

[8 of 24 positions shown; findings below may reference images not displayed]

ACR Breast Density Category c: The breast tissue is heterogeneously
dense, which may obscure small masses.
FINDINGS: There is a persistent oval, circumscribed lobulated mass in the
slightly lateral right breast at mid depth. Possible mass in the
superior central left breast at mid depth partially effaces on
additional views. Further evaluation with ultrasound was performed.

Targeted ultrasound is performed, showing benign, circumscribed
cystic clusters throughout the bilateral breasts. The largest of
these on the right are at the 9 o'clock position 5 cm from the
nipple and 6 o'clock position 2 cm from the nipple. They measure 10
x 7 x 5 mm and 9 x 7 x 4 mm respectively. The largest on the left
side are located at the 1 o'clock position 5 cm from the nipple and
11 o'clock position 5 cm from the nipple. They measure 13 x 3 x 16
mm and 9 x 10 x 4 mm respectively. These correlate well with the
mammographic findings.
IMPRESSION: Benign bilateral fibrocystic changes corresponding with the
screening mammographic findings. No further follow-up required.

RECOMMENDATION:
Screening mammogram in one year.(Code:3L-X-7CH)

I have discussed the findings and recommendations with the patient.
If applicable, a reminder letter will be sent to the patient
regarding the next appointment.

BI-RADS CATEGORY  2: Benign.

## 2022-06-26 ENCOUNTER — Ambulatory Visit: Payer: 59 | Admitting: Nurse Practitioner

## 2022-07-23 ENCOUNTER — Ambulatory Visit: Payer: 59 | Admitting: Allergy & Immunology

## 2022-10-20 ENCOUNTER — Other Ambulatory Visit (HOSPITAL_COMMUNITY)
Admission: RE | Admit: 2022-10-20 | Discharge: 2022-10-20 | Disposition: A | Discharge status: Home / Self Care | Payer: Medicaid Other | Source: Ambulatory Visit | Attending: Women's Health | Admitting: Women's Health

## 2022-10-20 ENCOUNTER — Ambulatory Visit (INDEPENDENT_AMBULATORY_CARE_PROVIDER_SITE_OTHER): Payer: Medicaid Other | Admitting: Women's Health

## 2022-10-20 ENCOUNTER — Encounter: Payer: Self-pay | Admitting: Women's Health

## 2022-10-20 VITALS — BP 121/75 | HR 90 | Ht 67.0 in | Wt 206.0 lb

## 2022-10-20 DIAGNOSIS — Z1231 Encounter for screening mammogram for malignant neoplasm of breast: Secondary | ICD-10-CM

## 2022-10-20 DIAGNOSIS — Z113 Encounter for screening for infections with a predominantly sexual mode of transmission: Secondary | ICD-10-CM

## 2022-10-20 DIAGNOSIS — Z01419 Encounter for gynecological examination (general) (routine) without abnormal findings: Secondary | ICD-10-CM | POA: Insufficient documentation

## 2022-10-20 NOTE — Progress Notes (Signed)
WELL-WOMAN EXAMINATION Patient name: Chelsea Cortez MRN [id number]  Date of birth: [birth year] Chief Complaint:   Gynecologic Exam  History of Present Illness:   Chelsea Cortez is a 42 y.o. G6P2002 Caucasian female being seen today for a routine well-woman exam.  Current complaints: none  PCP: Lacinda Axon      No LMP recorded. Patient has had an implant. The current method of family planning is Nexplanon inserted 04/23/20 Last pap 09/13/19. Results were: NILM w/ HRHPV negative. H/O abnormal pap: no Last mammogram: 11/26/21. Results were: abnormal , on f/u diagnostic mammo & u/s were bilateral fibrocystic changes, birads 2, no further f/u needed . Family h/o breast cancer: no Last colonoscopy: never. Results were: N/A. Family h/o colorectal cancer: yes MU     10/20/2022   10:24 AM 10/01/2021    2:52 PM  Depression screen PHQ 2/9  Decreased Interest 1 1  Down, Depressed, Hopeless 1 1  PHQ - 2 Score 2 2  Altered sleeping 1 1  Tired, decreased energy 1 3  Change in appetite 1 0  Feeling bad or failure about yourself  1 1  Trouble concentrating 0 0  Moving slowly or fidgety/restless 0 0  Suicidal thoughts 0 0  PHQ-9 Score 6 7        10/20/2022   10:24 AM 10/01/2021    2:52 PM  GAD 7 : Generalized Anxiety Score  Nervous, Anxious, on Edge 1 3  Control/stop worrying 2 3  Worry too much - different things 2 3  Trouble relaxing 1 3  Restless 1 2  Easily annoyed or irritable 1 2  Afraid - awful might happen 0 0  Total GAD 7 Score 8 16     Review of Systems:   Pertinent items are noted in HPI Denies any headaches, blurred vision, fatigue, shortness of breath, chest pain, abdominal pain, abnormal vaginal discharge/itching/odor/irritation, problems with periods, bowel movements, urination, or intercourse unless otherwise stated above. Pertinent History Reviewed:  Reviewed past medical,surgical, social and family history.  Reviewed problem list, medications and allergies. Physical  Assessment:   Vitals:   10/20/22 1027  BP: 121/75  Pulse: 90  Weight: 206 lb (93.4 kg)  Height: '5\' 7"'$  (1.702 m)  Body mass index is 32.26 kg/m.        Physical Examination:   General appearance - well appearing, and in no distress  Mental status - alert, oriented to person, place, and time  Psych:  She has a normal mood and affect  Skin - warm and dry, normal color, no suspicious lesions noted  Chest - effort normal, all lung fields clear to auscultation bilaterally  Heart - normal rate and regular rhythm  Neck:  midline trachea, no thyromegaly or nodules  Breasts - breasts appear normal, no suspicious masses, no skin or nipple changes or  axillary nodes  Abdomen - soft, nontender, nondistended, no masses or organomegaly  Pelvic - VULVA: normal appearing vulva with no masses, tenderness or lesions  VAGINA: normal appearing vagina with normal color and discharge, no lesions  CERVIX: normal appearing cervix without discharge or lesions, no CMT  Thin prep pap is done w/ HR HPV cotesting  UTERUS: uterus is felt to be normal size, shape, consistency and nontender   ADNEXA: No adnexal masses or tenderness noted.  Extremities:  No swelling or varicosities noted  Chaperone: Celene Squibb    No results found for this or any previous visit (from the past 24 hour(s)).  Assessment &  Plan:  1) Well-Woman Exam  2) STD screen for FP [facility]> gc/ct on pap, HIV, RPR  Labs/procedures today: pap, STD labs  Mammogram:  screening mammo ordered, pt to call to schedule for after 11/27/22 , or sooner if problems Colonoscopy: @ 42yo, or sooner if problems  Orders Placed This Encounter  Procedures   MM 3D SCREEN BREAST BILATERAL   HIV Antibody (routine testing w rflx)   RPR    Meds: No orders of the defined types were placed in this encounter.   Follow-up: Return for Aug for , Nexplanon removal/reinsertion; then 51yrfor , Physical.  KRoma SchanzCNM, [facility]2/26/2024 10:51 AM

## 2022-10-20 NOTE — Patient Instructions (Signed)
Call [phone] to schedule your mammogram

## 2022-10-21 LAB — HIV ANTIBODY (ROUTINE TESTING W REFLEX): HIV Screen 4th Generation wRfx: NONREACTIVE

## 2022-10-21 LAB — RPR: RPR Ser Ql: NONREACTIVE

## 2022-10-22 LAB — CYTOLOGY - PAP
Chlamydia: NEGATIVE
Comment: NEGATIVE
Comment: NEGATIVE
Comment: NORMAL
Diagnosis: UNDETERMINED — AB
High risk HPV: NEGATIVE
Neisseria Gonorrhea: NEGATIVE

## 2022-10-28 ENCOUNTER — Encounter: Payer: Self-pay | Admitting: Women's Health

## 2022-10-28 DIAGNOSIS — R87619 Unspecified abnormal cytological findings in specimens from cervix uteri: Secondary | ICD-10-CM | POA: Insufficient documentation

## 2022-11-18 ENCOUNTER — Ambulatory Visit (INDEPENDENT_AMBULATORY_CARE_PROVIDER_SITE_OTHER): Payer: Medicaid Other | Admitting: Obstetrics & Gynecology

## 2022-11-18 VITALS — BP 127/82 | HR 107 | Ht 67.0 in | Wt 209.2 lb

## 2022-11-18 DIAGNOSIS — Z3046 Encounter for surveillance of implantable subdermal contraceptive: Secondary | ICD-10-CM

## 2022-11-18 NOTE — Progress Notes (Signed)
   GYN VISIT Patient name: Chelsea Cortez MRN [id number]  Date of birth: [birth year] Chief Complaint:   No chief complaint on file.  History of Present Illness:   Chelsea Cortez is a 42 y.o. G53P2002  female being seen today for Nexplanon removal- device placed about 35yrs ago.  Currently not sexually active.  Plans to use condoms.     No LMP recorded. Patient has had an implant.     10/20/2022   10:24 AM 10/01/2021    2:52 PM  Depression screen PHQ 2/9  Decreased Interest 1 1  Down, Depressed, Hopeless 1 1  PHQ - 2 Score 2 2  Altered sleeping 1 1  Tired, decreased energy 1 3  Change in appetite 1 0  Feeling bad or failure about yourself  1 1  Trouble concentrating 0 0  Moving slowly or fidgety/restless 0 0  Suicidal thoughts 0 0  PHQ-9 Score 6 7     Review of Systems:   Pertinent items are noted in HPI Denies fever/chills, dizziness, headaches, visual disturbances, fatigue, shortness of breath, chest pain, abdominal pain, vomiting, no problems with periods, bowel movements, urination, or intercourse unless otherwise stated above.  Pertinent History Reviewed:  Reviewed past medical,surgical, social, obstetrical and family history.  Reviewed problem list, medications and allergies. Physical Assessment:   Vitals:   11/18/22 1425  BP: 127/82  Pulse: (!) 107  Weight: 209 lb 3.2 oz (94.9 kg)  Height: 5\' 7"  (1.702 m)  Body mass index is 32.77 kg/m.       Physical Examination:   General appearance: alert, well appearing, and in no distress  Psych: mood appropriate, normal affect  Skin: warm & dry   Cardiovascular: normal heart rate noted  Respiratory: normal respiratory effort, no distress  Abdomen: soft, non-tender   Pelvic: examination not indicated  Extremities: left arm with palpable device   NEXPLANON REMOVAL    Risks/benefits/side effects of Nexplanon have been discussed and her questions have been answered.  Specifically, a failure rate of 08/998 has been  reported, with an increased failure rate if pt takes [place] and/or antiseizure medicaitons.  She is aware of the common side effect of irregular bleeding, which the incidence of decreases over time. Signed copy of informed consent in chart.    Nexplanon site identified.  Area prepped in usual sterile fashon. Two cc's of 2% lidocaine was used to anesthetize the area. A small stab incision was made right beside the implant on the distal portion.  The Nexplanon rod was grasped using hemostats and removed intact without difficulty.  Steri-strips and a pressure bandage was applied.  There was less than 3 cc blood loss. There were no complications.  The patient tolerated the procedure well.   Assessment & Plan:  1) Nexplanon removal -device removed without complications -f/u prn -post procedure instructions reviewed  Continue yearly exam.  Follow up if desires other form of contraception   Return in about 1 year (around 11/18/2023) for Annual.   Janyth Pupa, DO Attending [place], [place] for [place], [place]

## 2024-02-02 ENCOUNTER — Encounter: Payer: Self-pay | Admitting: Adult Health

## 2024-02-02 ENCOUNTER — Ambulatory Visit: Admitting: Adult Health

## 2024-02-02 VITALS — BP 125/81 | HR 76 | Ht 67.0 in | Wt 199.0 lb

## 2024-02-02 DIAGNOSIS — Z3202 Encounter for pregnancy test, result negative: Secondary | ICD-10-CM | POA: Insufficient documentation

## 2024-02-02 DIAGNOSIS — Z30017 Encounter for initial prescription of implantable subdermal contraceptive: Secondary | ICD-10-CM

## 2024-02-02 LAB — POCT URINE PREGNANCY: Preg Test, Ur: NEGATIVE

## 2024-02-02 MED ORDER — ETONOGESTREL 68 MG ~~LOC~~ IMPL
68.0000 mg | DRUG_IMPLANT | Freq: Once | SUBCUTANEOUS | Status: AC
  Administered 2024-02-02: 68 mg via SUBCUTANEOUS

## 2024-02-02 NOTE — Progress Notes (Signed)
  Subjective:     Patient ID: Chelsea Cortez, female   DOB: 09/27/1980, 43 y.o.   MRN: [id number]  HPI Chelsea Cortez is a 43 year old white female, single, G2P2002 in for nexplanon  insertion. She has had in past. Last sex 3 weeks ago.    Component Value Date/Time   DIAGPAP (A) 10/20/2022 1027    - Atypical squamous cells of undetermined significance (ASC-US )   DIAGPAP  09/13/2019 1542    - Negative for intraepithelial lesion or malignancy (NILM)   DIAGPAP  10/22/2016 0000    NEGATIVE FOR INTRAEPITHELIAL LESIONS OR MALIGNANCY.   DIAGPAP TRICHOMONAS VAGINALIS PRESENT. 10/22/2016 0000   HPVHIGH Negative 10/20/2022 1027   HPVHIGH Negative 09/13/2019 1542   ADEQPAP  10/20/2022 1027    Satisfactory for evaluation; transformation zone component PRESENT.   ADEQPAP  09/13/2019 1542    Satisfactory for evaluation; transformation zone component PRESENT.   ADEQPAP  10/22/2016 0000    Satisfactory for evaluation  endocervical/transformation zone component PRESENT.    PCP is Dr Debrah Fan   Review of Systems For nexplanon  insertion Reviewed past medical,surgical, social and family history. Reviewed medications and allergies.     Objective:   Physical Exam BP 125/81 (BP Location: Right Arm, Patient Position: Sitting, Cuff Size: Normal)   Pulse 76   Ht 5\' 7"  (1.702 m)   Wt 199 lb (90.3 kg)   LMP 01/28/2024   BMI 31.17 kg/m  UPT is negative Consent signed, time out called. Left arm cleansed with betadine, and injected with 1.5 cc 2% lidocaine and waited til numb. Nexplanon  easily inserted and steri strips applied.Rod easily palpated by provider and pt. Pressure dressing applied.    Fall risk is low  Upstream - 02/02/24 1108       Pregnancy Intention Screening   Does the patient want to become pregnant in the next year? No    Does the patient's partner want to become pregnant in the next year? No    Would the patient like to discuss contraceptive options today? No      Contraception Wrap Up    Current Method Abstinence    End Method Hormonal Implant    Contraception Counseling Provided Yes    How was the end contraceptive method provided? Provided on site             Assessment:     1. Pregnancy examination or test, negative result - POCT urine pregnancy  2. Nexplanon  insertion (Primary) Nexplanon  placed in  left arm Lot #  [id number] 2027-06 - etonogestrel  (NEXPLANON ) implant 68 mg    Use condoms x 2 weeks, keep clean and dry x 24 hours, no heavy lifting, keep steri strips on x 72 hours, Keep pressure dressing on x 24 hours. Follow up prn problems.  Plan:     Remove nexplanon  in 3 years or sooner if desired

## 2024-02-02 NOTE — Patient Instructions (Signed)
Use condoms x 2 weeks, keep clean and dry x 24 hours, no heavy lifting, keep steri strips on x 72 hours, Keep pressure dressing on x 24 hours. Follow up prn problems.  

## 2024-02-29 ENCOUNTER — Other Ambulatory Visit: Payer: Self-pay

## 2024-02-29 ENCOUNTER — Encounter: Payer: Self-pay | Admitting: Neurology

## 2024-02-29 DIAGNOSIS — R202 Paresthesia of skin: Secondary | ICD-10-CM

## 2024-02-29 NOTE — Addendum Note (Signed)
 Addended by: DASIE RHODY A on: 02/29/2024 03:00 PM   Modules accepted: Orders

## 2024-03-01 ENCOUNTER — Other Ambulatory Visit: Payer: Self-pay

## 2024-03-01 DIAGNOSIS — R202 Paresthesia of skin: Secondary | ICD-10-CM

## 2024-03-01 NOTE — Progress Notes (Signed)
 Opened in error

## 2024-03-28 ENCOUNTER — Ambulatory Visit: Admitting: Neurology

## 2024-03-28 DIAGNOSIS — R202 Paresthesia of skin: Secondary | ICD-10-CM | POA: Diagnosis not present

## 2024-03-28 NOTE — Procedures (Signed)
 [facility]  [address], [address]  [place], [place] [address] Tel: [phone] Fax: [phone] Test Date:  03/28/2024  Patient: Chelsea Cortez DOB: 10-15-1980 Physician: Venetia Potters, MD  Sex: Female Height: 5' 7 Ref Phys: Joesph Galeazzi, [place]  ID#: [id number]   Technician:    History: This is a 43 year old female with paresthesia of right upper limb.  NCV & EMG Findings: Extensive electrodiagnostic evaluation of the right upper limb shows: Right median, median-ulnar palmar, ulnar, and radial sensory responses are within normal limits. Right median (APB) and ulnar (ADM) motor responses are within normal limits. There is no evidence of active or chronic motor axon loss changes affecting any of the tested muscles on needle examination. Motor unit configuration and recruitment pattern is within normal limits.  Impression: This is a normal study. Specifically: No electrodiagnostic evidence of a right cervical (C5-C8) motor radiculopathy. No electrodiagnostic evidence of a right median mononeuropathy at or distal to the wrist (ie: carpal tunnel syndrome). Screening studies for right ulnar or radial mononeuropathies are normal.    ___________________________ Venetia Potters, MD    Nerve Conduction Studies Motor Nerve Results    Latency Amplitude F-Lat Segment Distance CV Comment  Site (ms) Norm (mV) Norm (ms)  (cm) (m/s) Norm   Right Median (APB) Motor  Wrist 2.7  < 3.9 9.9  > 6.0        Elbow 7.9 - 9.6 -  Elbow-Wrist 29 56  > 50   Right Ulnar (ADM) Motor  Wrist 2.1  < 3.1 11.9  > 7.0        Bel elbow 5.9 - 11.3 -  Bel elbow-Wrist 23 61  > 50   Ab elbow 7.5 - 11.1 -  Ab elbow-Bel elbow 10 63 -    Sensory Sites    Neg Peak Lat Amplitude (O-P) Segment Distance Velocity Comment  Site (ms) Norm (V) Norm  (cm) (ms)   Right Median Sensory  Wrist-Dig II 3.2  < 3.4 28  > 20 Wrist-Dig II 13    Right Median-Ulnar Palmar Sensory       Median  Palm-Wrist 2.1  < 2.2  50  > 10 Palm-Wrist 8         Ulnar  Palm-Wrist 1.90  < 2.2 20  > 5 Palm-Wrist 8    Right Radial Sensory  Forearm-Wrist 1.95  < 2.7 19  > 18 Forearm-Wrist 10    Right Ulnar Sensory  Wrist-Dig V 2.9  < 3.1 26  > 12 Wrist-Dig V 11     Inter-Nerve Comparisons   Nerve 1 Value 1 Nerve 2 Value 2 Parameter Result Normal  Sensory Sites  R Median Palm-Wrist 2.1 ms R Ulnar Palm-Wrist 1.90 ms Peak Lat Diff 0.20 ms <0.40   Electromyography   Side Muscle Ins.Act Fibs Fasc Recrt Amp Dur Poly Activation Comment  Right FDI Nml Nml Nml Nml Nml Nml Nml Nml N/A  Right EIP Nml Nml Nml Nml Nml Nml Nml Nml N/A  Right Pronator teres Nml Nml Nml Nml Nml Nml Nml Nml N/A  Right Biceps Nml Nml Nml Nml Nml Nml Nml Nml N/A  Right Triceps Nml Nml Nml Nml Nml Nml Nml Nml N/A  Right Deltoid Nml Nml Nml Nml Nml Nml Nml Nml N/A  Right C7 PSP Nml Nml Nml Nml Nml Nml Nml Nml N/A      Waveforms:  Motor      Sensory

## 2024-04-19 ENCOUNTER — Encounter: Payer: Self-pay | Admitting: Neurology

## 2024-07-07 NOTE — Progress Notes (Deleted)
 Initial neurology clinic note  Reason for Evaluation: Consultation requested by Orma Joesph HERO, PA for an opinion regarding numbness in right upper limb. My final recommendations will be communicated back to the requesting physician by way of shared medical record or letter to requesting physician via [place]  mail.  HPI: This is Ms. Chelsea Cortez, a 43 y.o. ***-handed female with a medical history of fibromyalgia, anxiety, current smoker*** who presents to neurology clinic with the chief complaint of ***. The patient is accompanied by ***.  *** Numbness in right arm PCP thought consistent with CTS so recommended brace and EMG I did EMG on 03/28/24 of RUE that was normal.  The patient has not*** had similar episodes of symptoms in the past. ***  Muscle bulk loss? *** Muscle pain? ***  Cramps/Twitching? *** Suggestion of myotonia/difficulty relaxing after contraction? ***  Fatigable weakness?*** Does strength improve after brief exercise?***  Able to brush hair/teeth without difficulty? *** Able to button shirts/use zips? *** Clumsiness/dropping grasped objects?*** Can you arise from squatted position easily? *** Able to get out of chair without using arms? *** Able to walk up steps easily? *** Use an assistive device to walk? *** Significant imbalance with walking? *** Falls?*** Any change in urine color, especially after exertion/physical activity? ***  The patient denies*** symptoms suggestive of oculobulbar weakness including diplopia, ptosis, dysphagia, poor saliva control, dysarthria/dysphonia, impaired mastication, facial weakness/droop.  There are no*** neuromuscular respiratory weakness symptoms, particularly orthopnea>dyspnea.   Pseudobulbar affect is absent***.  The patient does not*** report symptoms referable to autonomic dysfunction including impaired sweating, heat or cold intolerance, excessive mucosal dryness, gastroparetic early satiety, postprandial abdominal  bloating, constipation, bowel or bladder dyscontrol, erectile dysfunction*** or syncope/presyncope/orthostatic intolerance.  There are no*** complaints relating to other symptoms of small fiber modalities including paresthesia/pain.  The patient has not *** noticed any recent skin rashes nor does he*** report any constitutional symptoms like fever, night sweats, anorexia or unintentional weight loss.  EtOH use: ***  Restrictive diet? *** Family history of neuropathy/myopathy/NM disease?***  Previous labs, electrodiagnostics, and neuroimaging are summarized below, but pertinent findings include***  Any biopsy done? *** Current medications being tried for the patient's symptoms include ***  Prior medications that have been tried: ***   MEDICATIONS:  Outpatient Encounter Medications as of 07/20/2024  Medication Sig   cetirizine  (ZYRTEC  ALLERGY ) 10 MG tablet Take 1 tablet (10 mg total) by mouth 2 (two) times daily as needed for allergies (Can take an extra dose during flare ups.).   etonogestrel  (NEXPLANON ) 68 MG IMPL implant 1 each by Subdermal route once.   famotidine  (PEPCID ) 20 MG tablet Take 1 tablet (20 mg total) by mouth 2 (two) times daily.   fluticasone  (FLONASE ) 50 MCG/ACT nasal spray Place 1 spray into both nostrils in the morning and at bedtime.   montelukast  (SINGULAIR ) 10 MG tablet Take 1 tablet (10 mg total) by mouth at bedtime.   No facility-administered encounter medications on file as of 07/20/2024.    PAST MEDICAL HISTORY: Past Medical History:  Diagnosis Date   Angio-edema    Anxiety    Chronic left shoulder pain    Chronic neck and back pain    Eczema    Farsightedness    Fibromyalgia    Headache    Nexplanon  in place 01/15/2015   Got nexplanon  inserted 01/02/14 in left arm   Pain management 08/25/2013   Urticaria     PAST SURGICAL HISTORY: No past surgical history on file.  ALLERGIES: No Known Allergies  FAMILY HISTORY: Family History  Problem  Relation Age of Onset   Urticaria Mother    Asthma Mother    Diabetes Mother    Cancer Mother        kidney   Colon polyps Mother        20s   Cirrhosis Mother        fatty liver   Allergic rhinitis Maternal Grandmother    Urticaria Maternal Grandmother    Asthma Maternal Grandmother    Diabetes Maternal Grandmother    Heart murmur Maternal Grandmother    ADD / ADHD Son    ADD / ADHD Son    Colon cancer Maternal Uncle        36s   Eczema Neg Hx     SOCIAL HISTORY: Social History   Tobacco Use   Smoking status: Every Day    Current packs/day: 1.00    Average packs/day: 1 pack/day for 15.0 years (15.0 ttl pk-yrs)    Types: Cigarettes   Smokeless tobacco: Never  Vaping Use   Vaping status: Never Used  Substance Use Topics   Alcohol use: No   Drug use: No   Social History   Social History Narrative   Not on file     OBJECTIVE: PHYSICAL EXAM: There were no vitals taken for this visit.  General:*** General appearance: Awake and alert. No distress. Cooperative with exam.  Skin: No obvious rash or jaundice. HEENT: Atraumatic. Anicteric. Lungs: Non-labored breathing on room air  Heart: Regular Abdomen: Soft, non tender. Extremities: No edema. No obvious deformity.  Musculoskeletal: No obvious joint swelling. Psych: Affect appropriate.  Neurological: Mental Status: Alert. Speech fluent. No pseudobulbar affect Cranial Nerves: CNII: No RAPD. Visual fields grossly intact. CNIII, IV, VI: PERRL. No nystagmus. EOMI. CN V: Facial sensation intact bilaterally to fine touch. [place]eter clench strong. Jaw jerk***. CN VII: Facial muscles symmetric and strong. No ptosis at rest or after sustained upgaze***. CN VIII: Hearing grossly intact bilaterally. CN IX: No hypophonia. CN X: Palate elevates symmetrically. CN XI: Full strength shoulder shrug bilaterally. CN XII: Tongue protrusion full and midline. No atrophy or fasciculations. No significant dysarthria*** Motor:  Tone is ***. *** fasciculations in *** extremities. *** atrophy. No grip or percussive myotonia.***  Individual muscle group testing (MRC grade out of 5):  Movement     Neck flexion ***    Neck extension ***     Right Left   Shoulder abduction *** ***   Shoulder adduction *** ***   Shoulder ext rotation *** ***   Shoulder int rotation *** ***   Elbow flexion *** ***   Elbow extension *** ***   Wrist extension *** ***   Wrist flexion *** ***   Finger abduction - FDI *** ***   Finger abduction - ADM *** ***   Finger extension *** ***   Finger distal flexion - 2/3 *** ***   Finger distal flexion - 4/5 *** ***   Thumb flexion - FPL *** ***   Thumb abduction - APB *** ***    Hip flexion *** ***   Hip extension *** ***   Hip adduction *** ***   Hip abduction *** ***   Knee extension *** ***   Knee flexion *** ***   Dorsiflexion *** ***   Plantarflexion *** ***   Inversion *** ***   Eversion *** ***   Great toe extension *** ***   Great toe flexion *** ***  Reflexes:  Right Left   Bicep *** ***   Tricep *** ***   BrRad *** ***   Knee *** ***   Ankle *** ***    Pathological Reflexes: Babinski: *** response bilaterally*** Hoffman: *** Troemner: *** Pectoral: *** Palmomental: *** Facial: *** Midline tap: *** Sensation: Pinprick: *** Vibration: *** Temperature: *** Proprioception: *** Coordination: Intact finger-to- nose-finger bilaterally. Romberg negative.*** Gait: Able to rise from chair with arms crossed unassisted. Normal, narrow-based gait. Able to tandem walk. Able to walk on toes and heels.***  Lab and Test Review: Internal labs: 12/20/21: ESR wnl CRP wnl  10/01/21: HbA1c 5.6  External labs: ***  Imaging/Procedures: CT head and cervical spine wo contrast (03/27/15): FINDINGS: CT HEAD FINDINGS   There is no intracranial hemorrhage, mass or evidence of acute infarction. There is no extra-axial fluid collection. Gray matter and white matter  appear normal. Cerebral volume is normal for age. Brainstem and posterior fossa are unremarkable. The CSF spaces appear normal.   The bony structures are intact. The visible portions of the paranasal sinuses are clear.   CT CERVICAL SPINE FINDINGS   The vertebral column, pedicles and facet articulations are intact. There is no evidence of acute fracture. No acute soft tissue abnormalities are evident.   No significant arthritic changes are evident.   IMPRESSION: 1. Negative for acute intracranial traumatic injury.  Normal brain. 2. Maude for acute cervical spine fracture.  Thoracic and lumbar spine xray (03/27/15): IMPRESSION: Normal alignment and no acute bony findings in the thoracic or lumbar spine.  EMG (03/28/24): NCV & EMG Findings: Extensive electrodiagnostic evaluation of the right upper limb shows: Right median, median-ulnar palmar, ulnar, and radial sensory responses are within normal limits. Right median (APB) and ulnar (ADM) motor responses are within normal limits. There is no evidence of active or chronic motor axon loss changes affecting any of the tested muscles on needle examination. Motor unit configuration and recruitment pattern is within normal limits.   Impression: This is a normal study. Specifically: No electrodiagnostic evidence of a right cervical (C5-C8) motor radiculopathy. No electrodiagnostic evidence of a right median mononeuropathy at or distal to the wrist (ie: carpal tunnel syndrome). Screening studies for right ulnar or radial mononeuropathies are normal. ***  ASSESSMENT: NICCOLE WITTHUHN is a 43 y.o. female who presents for evaluation of ***. *** has a relevant medical history of ***. *** neurological examination is pertinent for ***. Available diagnostic data is significant for ***. This constellation of symptoms and objective data would most likely localize to ***. ***  PLAN: -Blood work: *** ***  -Return to clinic ***  The impression  above as well as the plan as outlined below were extensively discussed with the patient (in the company of ***) who voiced understanding. All questions were answered to their satisfaction.  The patient was counseled on pertinent fall precautions per the printed material provided today, and as noted under the Patient Instructions section below.***  When available, results of the above investigations and possible further recommendations will be communicated to the patient via telephone/MyChart. Patient to call office if not contacted after expected testing turnaround time.   Total time spent reviewing records, interview, history/exam, documentation, and coordination of care on day of encounter:  *** min   Thank you for allowing me to participate in patient's care.  If I can answer any additional questions, I would be pleased to do so.  Venetia Potters, MD   CC: Orma Search Miranda, [place] [address]  [place] [address]  CC: Referring provider: Orma Joesph HERO, PA [address] [place],  [place] [address]

## 2024-07-20 ENCOUNTER — Ambulatory Visit: Admitting: Neurology
